# Patient Record
Sex: Female | Born: 1985
Health system: Southern US, Community
[De-identification: ages and names within clinical notes are randomized; demographics above are authoritative.]

## PROBLEM LIST (undated history)

## (undated) DIAGNOSIS — F502 Bulimia nervosa, unspecified: Secondary | ICD-10-CM

## (undated) DIAGNOSIS — N200 Calculus of kidney: Secondary | ICD-10-CM

## (undated) HISTORY — DX: Bulimia nervosa: F50.2

## (undated) HISTORY — DX: Calculus of kidney: N20.0

## (undated) HISTORY — DX: Bulimia nervosa, unspecified: F50.20

---

## 2006-11-30 ENCOUNTER — Emergency Department: Payer: Self-pay | Admitting: Emergency Medicine

## 2012-11-04 ENCOUNTER — Emergency Department: Payer: Self-pay | Admitting: Emergency Medicine

## 2012-11-04 LAB — COMPREHENSIVE METABOLIC PANEL
Albumin: 3.9 g/dL (ref 3.4–5.0)
BUN: 11 mg/dL (ref 7–18)
Bilirubin,Total: 0.2 mg/dL (ref 0.2–1.0)
Calcium, Total: 9 mg/dL (ref 8.5–10.1)
Chloride: 105 mmol/L (ref 98–107)
Co2: 24 mmol/L (ref 21–32)
Creatinine: 1.03 mg/dL (ref 0.60–1.30)
EGFR (African American): >60
EGFR (Non-African Amer.): >60
Glucose: 154 mg/dL — ABNORMAL HIGH (ref 65–99)
Osmolality: 280 (ref 275–301)
Potassium: 3.6 mmol/L (ref 3.5–5.1)
SGOT(AST): 23 U/L (ref 15–37)
SGPT (ALT): 28 U/L (ref 12–78)
Sodium: 139 mmol/L (ref 136–145)

## 2012-11-04 LAB — URINALYSIS, COMPLETE
Glucose,UR: NEGATIVE mg/dL (ref 0–75)
Nitrite: NEGATIVE
Protein: 100
RBC,UR: 3519 /HPF (ref 0–5)
WBC UR: 45 /HPF (ref 0–5)

## 2012-11-04 LAB — CBC
HGB: 13.5 g/dL (ref 12.0–16.0)
MCHC: 33.6 g/dL (ref 32.0–36.0)

## 2012-11-09 ENCOUNTER — Emergency Department: Payer: Self-pay | Admitting: Emergency Medicine

## 2012-11-09 LAB — URINALYSIS, COMPLETE
Glucose,UR: NEGATIVE mg/dL (ref 0–75)
Ketone: NEGATIVE
Leukocyte Esterase: NEGATIVE
Nitrite: NEGATIVE
Ph: 7 (ref 4.5–8.0)
Protein: NEGATIVE
RBC,UR: 1 /HPF (ref 0–5)
Squamous Epithelial: <1

## 2012-11-16 ENCOUNTER — Emergency Department: Payer: Self-pay | Admitting: Emergency Medicine

## 2012-11-16 LAB — CBC WITH DIFFERENTIAL/PLATELET
Basophil #: 0.1 10*3/uL (ref 0.0–0.1)
Eosinophil %: 0.2 %
HCT: 40.5 % (ref 35.0–47.0)
HGB: 14.1 g/dL (ref 12.0–16.0)
Lymphocyte #: 1.6 10*3/uL (ref 1.0–3.6)
MCHC: 34.8 g/dL (ref 32.0–36.0)
MCV: 86 fL (ref 80–100)
Neutrophil #: 10.1 10*3/uL — ABNORMAL HIGH (ref 1.4–6.5)
Neutrophil %: 80.1 %
Platelet: 357 10*3/uL (ref 150–440)
RDW: 13.7 % (ref 11.5–14.5)
WBC: 12.6 10*3/uL — ABNORMAL HIGH (ref 3.6–11.0)

## 2012-11-16 LAB — COMPREHENSIVE METABOLIC PANEL
Albumin: 4.3 g/dL (ref 3.4–5.0)
Bilirubin,Total: 0.4 mg/dL (ref 0.2–1.0)
Chloride: 106 mmol/L (ref 98–107)
EGFR (African American): >60
EGFR (Non-African Amer.): >60
Potassium: 3.7 mmol/L (ref 3.5–5.1)
SGOT(AST): 35 U/L (ref 15–37)
Total Protein: 8 g/dL (ref 6.4–8.2)

## 2012-11-16 LAB — URINALYSIS, COMPLETE
Bacteria: NONE SEEN
Bilirubin,UR: NEGATIVE
Leukocyte Esterase: NEGATIVE
Nitrite: NEGATIVE
Ph: 7 (ref 4.5–8.0)
Protein: NEGATIVE
RBC,UR: 1 /HPF (ref 0–5)
Specific Gravity: 1.013 (ref 1.003–1.030)
WBC UR: <1 /HPF (ref 0–5)

## 2012-11-16 LAB — LIPASE, BLOOD: Lipase: 91 U/L (ref 73–393)

## 2013-09-08 IMAGING — CT CT ABD-PELV W/ CM
1 of 2 series · 15 of 32 positions shown, 19 images · non-contrast
Comparison: none

REASON FOR EXAM: (1) R flank and abd pain; (2) R flank and abd pain
COMMENTS:

[Series 2: 3mm soft tissue · axial · 0.68mm/px · z∈[-438,+3]mm · 15 of 161 slices shown, 19 images]
[im 7/161  soft-tissue]
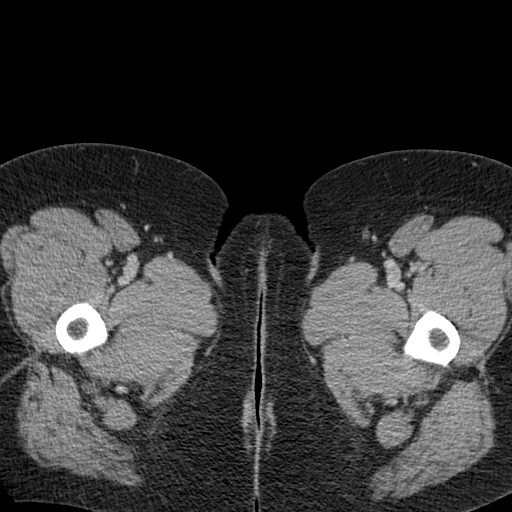
[im 7/161  bone]
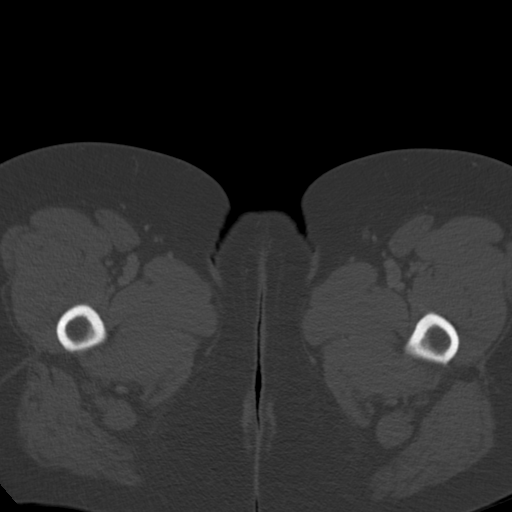
[im 21/161  soft-tissue]
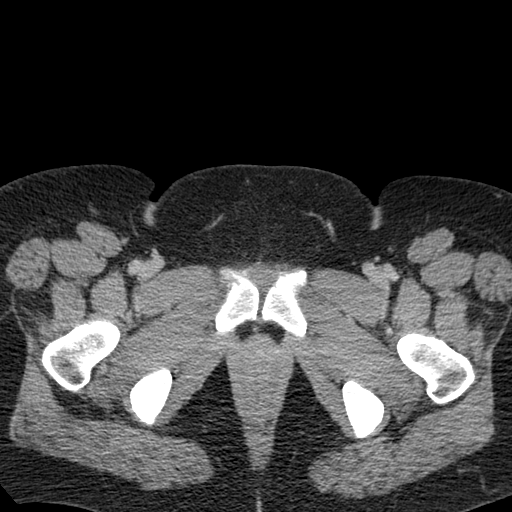
[im 34/161  soft-tissue]
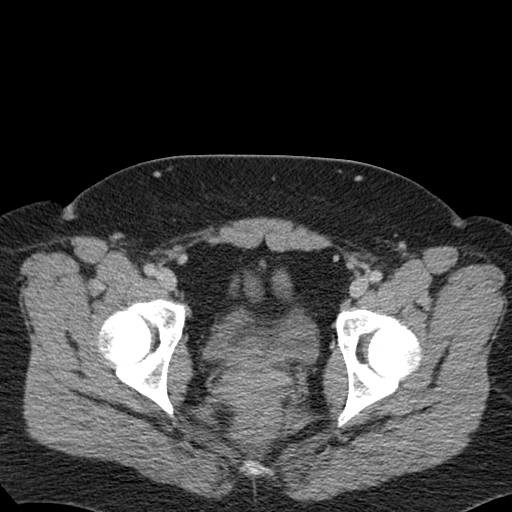
[im 47/161  soft-tissue]
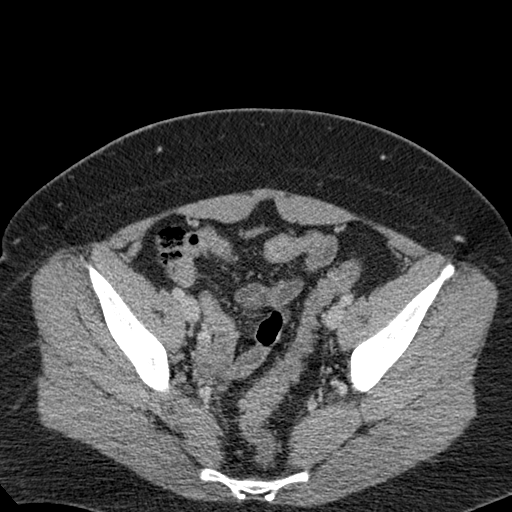
[im 54/161  soft-tissue]
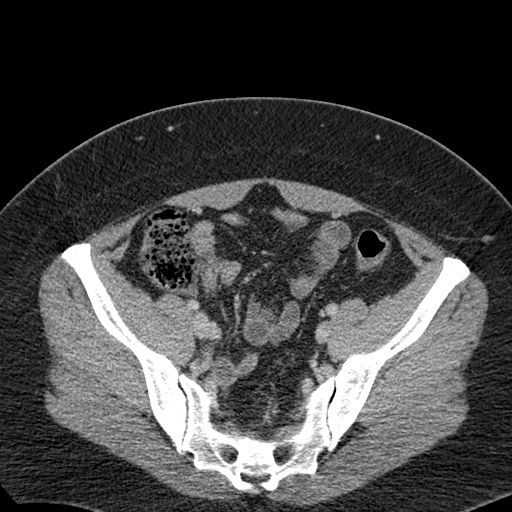
[im 67/161  soft-tissue]
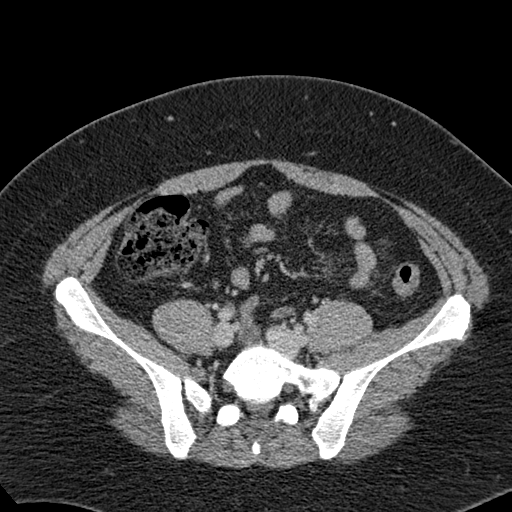
[im 81/161  soft-tissue]
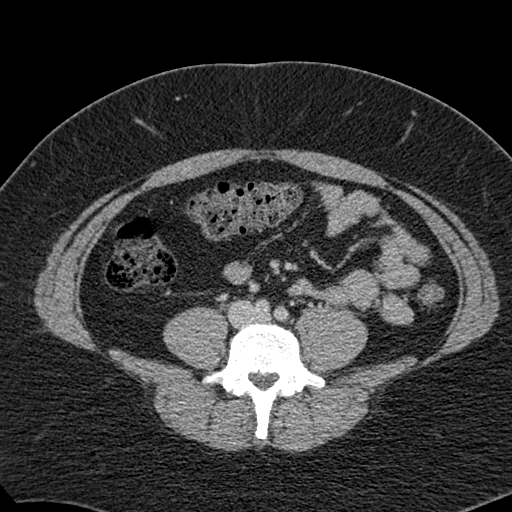
[im 94/161  soft-tissue]
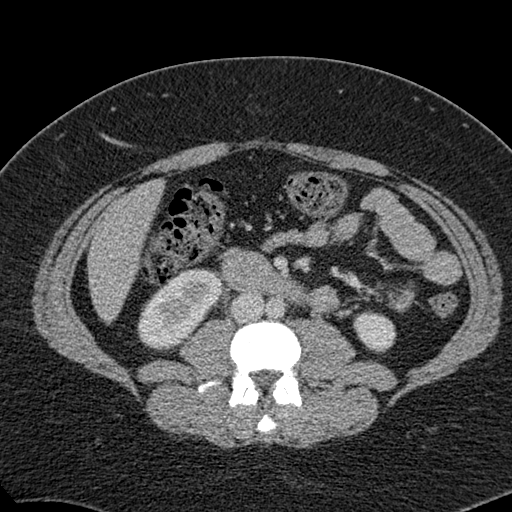
[im 107/161  soft-tissue]
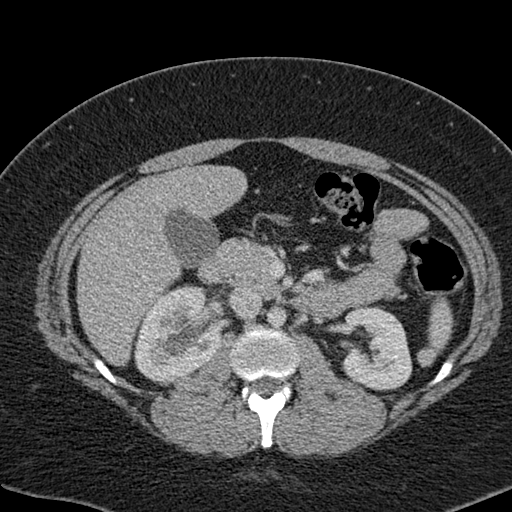
[im 107/161  bone]
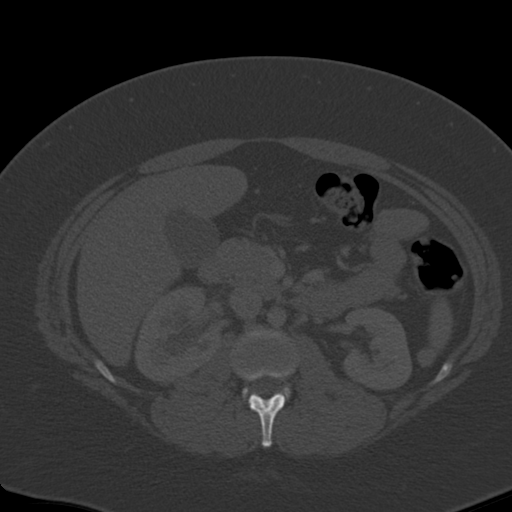
[im 114/161  soft-tissue]
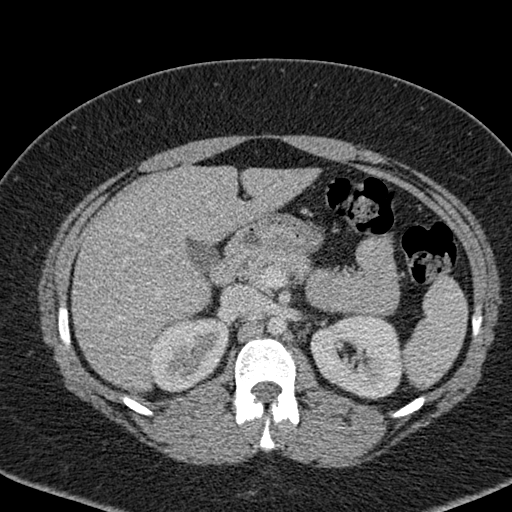
[im 127/161  soft-tissue]
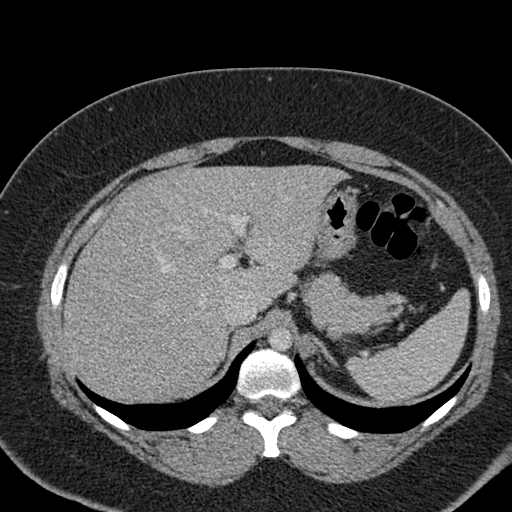
[im 134/161  lung]
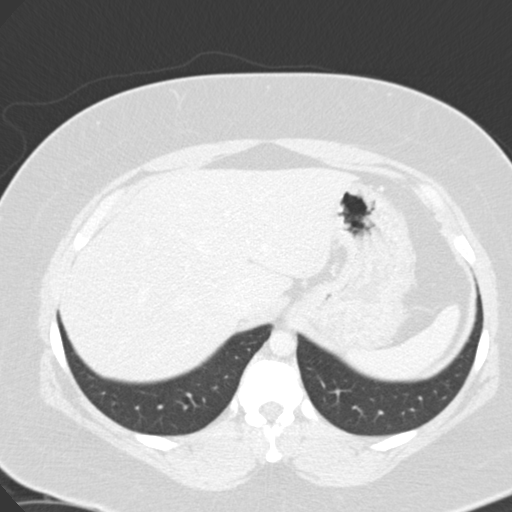
[im 141/161  soft-tissue]
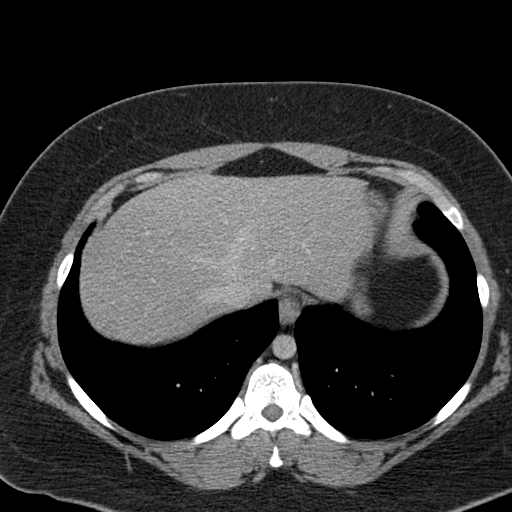
[im 141/161  lung]
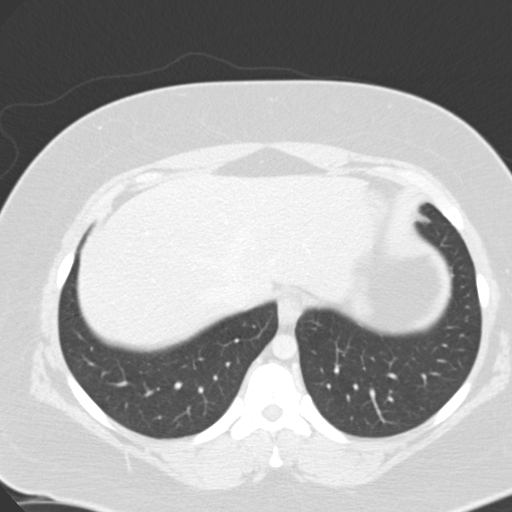
[im 147/161  lung]
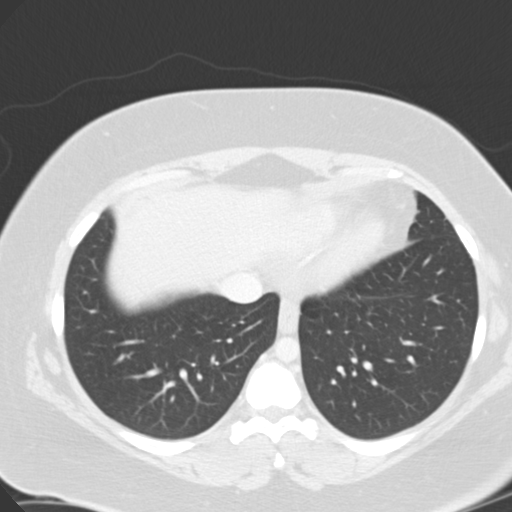
[im 154/161  soft-tissue]
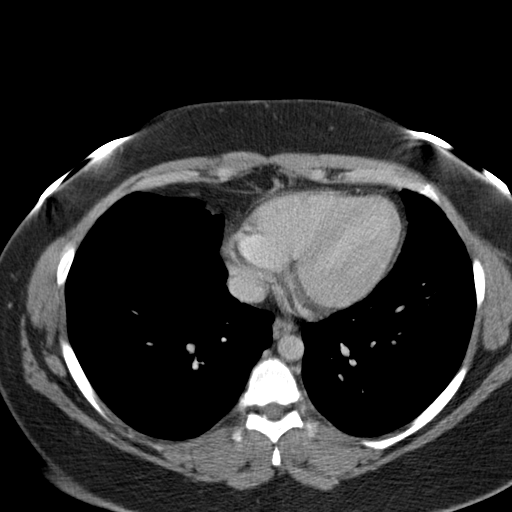
[im 154/161  lung]
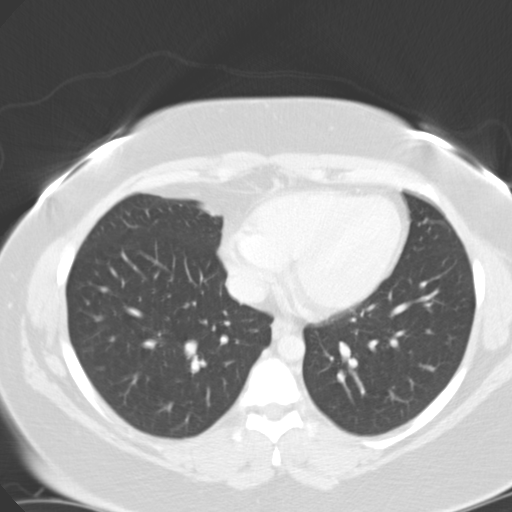

[15 of 32 positions shown; findings below may reference images not displayed]

PROCEDURE:     CT  - CT ABDOMEN / PELVIS  W  - November 04, 2012  [DATE]

RESULT:     In CT of the abdomen and pelvis is performed with 100 mL of
Nsovue-SNS iodinated intravenous contrast. Images are reconstructed in the
axial plane at 3 mm slice thickness. The patient has no previous exam for
comparison.

Images through the base the lungs show normal appearing aeration without a
focal mass, effusion or pneumothorax. There is asymmetric enhancement of the
kidneys with delayed enhancement the right kidney. There is mild right-sided
hydronephrosis. There is mild hydroureter leading to a focal calcific
density in the distal right ureter seen on image 126 and measuring up to 3
mm consistent with a right distal ureteral calculus. The left kidney shows
no obstructive change or nephrolithiasis. There is no ascites. The uterus
and adnexal structures appear unremarkable. A normal appearing appendix is
present. There is no abnormal bowel distention or wall thickening. No
radiopaque gallstones are evident. The liver, spleen, pancreas, gallbladder,
adrenal glands, abdominal aorta and bony structures appear unremarkable.
IMPRESSION: 1. Minimal right-sided hydronephrosis secondary to 3 mm distal right
ureteral stone.

[REDACTED]

## 2015-01-16 DIAGNOSIS — N200 Calculus of kidney: Secondary | ICD-10-CM | POA: Insufficient documentation

## 2015-01-16 DIAGNOSIS — F419 Anxiety disorder, unspecified: Secondary | ICD-10-CM | POA: Insufficient documentation

## 2016-05-18 DIAGNOSIS — F419 Anxiety disorder, unspecified: Secondary | ICD-10-CM | POA: Diagnosis not present

## 2016-05-18 DIAGNOSIS — R6882 Decreased libido: Secondary | ICD-10-CM | POA: Diagnosis not present

## 2016-05-18 DIAGNOSIS — Z6841 Body Mass Index (BMI) 40.0 and over, adult: Secondary | ICD-10-CM | POA: Diagnosis not present

## 2016-05-18 DIAGNOSIS — Z13220 Encounter for screening for lipoid disorders: Secondary | ICD-10-CM | POA: Diagnosis not present

## 2016-08-16 MED FILL — GIANVI 3-0.02 MG TABS: 3-0.02 | 84 days supply | Qty: 84 | Fill #0

## 2016-08-16 MED FILL — ESCITALOPRAM 5 MG TABLET: 5 | 90 days supply | Qty: 90 | Fill #0

## 2016-11-09 MED FILL — LORYNA 3-0.02 MG TAB: 3-0.02 | 84 days supply | Qty: 84 | Fill #0

## 2016-12-02 MED FILL — ESCITALOPRAM 5 MG TABLET: 5 | 90 days supply | Qty: 90 | Fill #1

## 2017-01-31 MED FILL — LORYNA 3-0.02 MG TAB: 3-0.02 | 84 days supply | Qty: 84 | Fill #1

## 2017-04-26 MED FILL — DROSPIR-ETH ESTRA 3/.02 MG: 3-0.02 | 84 days supply | Qty: 84 | Fill #2

## 2017-07-14 MED FILL — ESCITALOPRAM 5 MG TABLET: 5 | 90 days supply | Qty: 90 | Fill #0

## 2017-07-14 MED FILL — DROSPIR-ETH ESTRA 3/.02 MG: 3-0.02 | 84 days supply | Qty: 84 | Fill #3

## 2017-09-28 MED FILL — DROSPIR-ETH ESTRA 3/.02 MG: 3-0.02 | 84 days supply | Qty: 84 | Fill #0

## 2017-12-30 MED FILL — LORYNA 3-0.02 MG TABS: 3-0.02 | 84 days supply | Qty: 84 | Fill #0

## 2018-03-31 MED FILL — LORYNA 3-0.02 MG TABS: 3-0.02 | 84 days supply | Qty: 84 | Fill #0

## 2018-06-21 MED FILL — LORYNA 3-0.02 MG TABS: 3-0.02 | 28 days supply | Qty: 28 | Fill #0

## 2020-11-25 ENCOUNTER — Other Ambulatory Visit: Payer: Self-pay

## 2020-11-25 ENCOUNTER — Encounter: Payer: Self-pay | Admitting: Family Medicine

## 2020-11-25 ENCOUNTER — Ambulatory Visit: Payer: 59 | Admitting: Family Medicine

## 2020-11-25 VITALS — BP 124/72 | HR 91 | Ht 65.0 in | Wt 269.0 lb

## 2020-11-25 DIAGNOSIS — M25571 Pain in right ankle and joints of right foot: Secondary | ICD-10-CM

## 2020-11-25 DIAGNOSIS — F502 Bulimia nervosa: Secondary | ICD-10-CM | POA: Insufficient documentation

## 2020-11-25 DIAGNOSIS — M79671 Pain in right foot: Secondary | ICD-10-CM

## 2020-11-25 DIAGNOSIS — L84 Corns and callosities: Secondary | ICD-10-CM | POA: Diagnosis not present

## 2020-11-25 NOTE — Progress Notes (Signed)
   Subjective:    Patient ID: Jackie Pearson, female    DOB: Feb 06, 1986, 35 y.o.   MRN: [id number]  HPI Chief Complaint  Patient presents with  . new pt get established    New pt get established, right foot callus-    She is new to the practice and here to establish care. Previous medical care: no medical care in 6-7 years   Other providers: Nurse, mental health in [place] for bulimia if needed.   Complains of a large callus on her right foot that is worsening. It has been present for over a month. States it is now affecting how she walks and causing her to have foot pain and ankle pain due to altered gait.  States initially she tried different shoes and OTC callus removal kits without any improvement.    Hx of kidney stones   States she has a history of bulimia that has been in control for the past 3 years.    Denies fever, chills, dizziness, chest pain, palpitations, shortness of breath, abdominal pain, N/V/D, LE edema.   Reviewed allergies, medications, past medical, surgical, family, and social history.     Review of Systems Pertinent positives and negatives in the history of present illness.     Objective:   Physical Exam Constitutional:      General: She is not in acute distress.    Appearance: Normal appearance. She is not ill-appearing.  Musculoskeletal:       Feet:  Feet:     Comments: Large, raised callus to the medial plantar surface of her right foot. Right lateral ankle with trace edema. Normal foot exam otherwise.  Neurological:     Mental Status: She is alert.    BP 124/72   Pulse 91   Ht 5\' 5"  (1.651 m)   Wt 269 lb (122 kg)   LMP 10/29/2020   SpO2 98%   BMI 44.76 kg/m         Assessment & Plan:  Callus of foot - Plan: Ambulatory referral to Podiatry  Acute right ankle pain - Plan: Ambulatory referral to Podiatry  Right foot pain - Plan: Ambulatory referral to Podiatry  She is a pleasant 35 year old female who is new to the practice  here to establish care.  Due to the severity of her callus and foot pain, I am referring her to [facility].  She request the location to be [place]. She will follow-up here as needed or for a CPE.

## 2020-11-25 NOTE — Patient Instructions (Signed)
You will hear from [facility] in [place]

## 2020-11-28 ENCOUNTER — Other Ambulatory Visit: Payer: Self-pay

## 2020-11-28 ENCOUNTER — Encounter: Payer: Self-pay | Admitting: Podiatry

## 2020-11-28 ENCOUNTER — Ambulatory Visit (INDEPENDENT_AMBULATORY_CARE_PROVIDER_SITE_OTHER): Payer: 59 | Admitting: Podiatry

## 2020-11-28 DIAGNOSIS — L989 Disorder of the skin and subcutaneous tissue, unspecified: Secondary | ICD-10-CM

## 2020-11-28 DIAGNOSIS — L84 Corns and callosities: Secondary | ICD-10-CM | POA: Diagnosis not present

## 2020-11-28 NOTE — Progress Notes (Signed)
   Subjective: 35 y.o. female presenting to the office today as a new patient for evaluation of pain in the tenderness to the right plantar heel.  Patient states that approximately 7 months ago she began to develop callus to the area.  She believes it was initiated when she began compensating for left plantar fasciitis.  Over time the callus has become thicker and is very sensitive and tender to walk.  She presents for further treatment and evaluation   Past Medical History:  Diagnosis Date  . Bulimia   . Kidney stones      Objective:  Physical Exam General: Alert and oriented x3 in no acute distress  Dermatology: Diffuse large hyperkeratotic lesion(s) present on the plantar medial aspect of the right heel approximately 6 cm in length x4 cm in width. Pain on palpation noted. Skin is warm, dry and supple bilateral lower extremities. Negative for open lesions or macerations.  Vascular: Palpable pedal pulses bilaterally. No edema or erythema noted. Capillary refill within normal limits.  Neurological: Epicritic and protective threshold grossly intact bilaterally.   Musculoskeletal Exam: Pain on palpation at the keratotic lesion(s) noted. Range of motion within normal limits bilateral. Muscle strength 5/5 in all groups bilateral.  Assessment: 1.  Large hyperkeratotic callus plantar medial aspect right heel   Plan of Care:  1. Patient evaluated 2. Excisional debridement of keratoic lesion(s) using a chisel blade was performed without incident.  Salicylic acid applied 3.  Recommend salicylic acid daily. Salinocaine provided for the patient 4. 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 Felecia Shelling, DPM    [address] [place], [place] [address]                Office [phone]  Fax [phone]

## 2020-12-25 ENCOUNTER — Other Ambulatory Visit: Payer: Self-pay

## 2020-12-26 ENCOUNTER — Encounter: Payer: Self-pay | Admitting: Podiatry

## 2020-12-26 ENCOUNTER — Ambulatory Visit (INDEPENDENT_AMBULATORY_CARE_PROVIDER_SITE_OTHER): Payer: 59 | Admitting: Podiatry

## 2020-12-26 DIAGNOSIS — L84 Corns and callosities: Secondary | ICD-10-CM

## 2020-12-26 DIAGNOSIS — L989 Disorder of the skin and subcutaneous tissue, unspecified: Secondary | ICD-10-CM

## 2020-12-26 NOTE — Progress Notes (Signed)
   Subjective: 35 y.o. female presenting to the office today for follow-up evaluation of a hard thick tender callus to the plantar aspect of the right heel has been going on for over 7 months now.  She has been applying salicylic acid as instructed.  She has noticed significant relief.  She presents for further treatment and evaluation   Past Medical History:  Diagnosis Date   Bulimia    Kidney stones      Objective:  Physical Exam General: Alert and oriented x3 in no acute distress  Dermatology: Diffuse large hyperkeratotic lesion(s) present on the plantar medial aspect of the right heel approximately 6 cm in length x4 cm in width. Pain on palpation noted. Skin is warm, dry and supple bilateral lower extremities. Negative for open lesions or macerations.  Vascular: Palpable pedal pulses bilaterally. No edema or erythema noted. Capillary refill within normal limits.  Neurological: Epicritic and protective threshold grossly intact bilaterally.   Musculoskeletal Exam: Pain on palpation at the keratotic lesion(s) noted. Range of motion within normal limits bilateral. Muscle strength 5/5 in all groups bilateral.  Assessment: 1.  Large hyperkeratotic callus plantar medial aspect right heel   Plan of Care:  1. Patient evaluated 2. Excisional debridement of keratoic lesion(s) using a chisel blade was performed without incident.  Salicylic acid applied 3.  Continue salicylic acid every other day now.  Additional Salinocaine provided for the patient 4. 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 Felecia Shelling, DPM    [address] [place], [place] [address]                Office [phone]  Fax [phone]

## 2020-12-27 ENCOUNTER — Encounter: Payer: Self-pay | Admitting: Podiatry

## 2020-12-31 ENCOUNTER — Other Ambulatory Visit: Payer: Self-pay

## 2020-12-31 ENCOUNTER — Ambulatory Visit (INDEPENDENT_AMBULATORY_CARE_PROVIDER_SITE_OTHER): Payer: 59 | Admitting: Family Medicine

## 2020-12-31 ENCOUNTER — Encounter: Payer: Self-pay | Admitting: Family Medicine

## 2020-12-31 VITALS — BP 120/70 | HR 91 | Ht 65.0 in | Wt 267.2 lb

## 2020-12-31 DIAGNOSIS — Z1159 Encounter for screening for other viral diseases: Secondary | ICD-10-CM

## 2020-12-31 DIAGNOSIS — Z0000 Encounter for general adult medical examination without abnormal findings: Secondary | ICD-10-CM | POA: Diagnosis not present

## 2020-12-31 DIAGNOSIS — Z13220 Encounter for screening for lipoid disorders: Secondary | ICD-10-CM

## 2020-12-31 DIAGNOSIS — Z1329 Encounter for screening for other suspected endocrine disorder: Secondary | ICD-10-CM | POA: Diagnosis not present

## 2020-12-31 NOTE — Patient Instructions (Signed)
Preventive Care 21-34 Years Old, Female Preventive care refers to lifestyle choices and visits with your health care provider that can promote health and wellness. This includes: A yearly physical exam. This is also called an annual wellness visit. Regular dental and eye exams. Immunizations. Screening for certain conditions. Healthy lifestyle choices, such as: Eating a healthy diet. Getting regular exercise. Not using drugs or products that contain nicotine and tobacco. Limiting alcohol use. What can I expect for my preventive care visit? Physical exam Your health care provider may check your: Height and weight. These may be used to calculate your BMI (body mass index). BMI is a measurement that tells if you are at a healthy weight. Heart rate and blood pressure. Body temperature. Skin for abnormal spots. Counseling Your health care provider may ask you questions about your: Past medical problems. Family's medical history. Alcohol, tobacco, and drug use. Emotional well-being. Home life and relationship well-being. Sexual activity. Diet, exercise, and sleep habits. Work and work environment. Access to firearms. Method of birth control. Menstrual cycle. Pregnancy history. What immunizations do I need?  Vaccines are usually given at various ages, according to a schedule. Your health care provider will recommend vaccines for you based on your age, medicalhistory, and lifestyle or other factors, such as travel or where you work. What tests do I need?  Blood tests Lipid and cholesterol levels. These may be checked every 5 years starting at age 20. Hepatitis C test. Hepatitis B test. Screening Diabetes screening. This is done by checking your blood sugar (glucose) after you have not eaten for a while (fasting). STD (sexually transmitted disease) testing, if you are at risk. BRCA-related cancer screening. This may be done if you have a family history of breast, ovarian, tubal, or  peritoneal cancers. Pelvic exam and Pap test. This may be done every 3 years starting at age 21. Starting at age 30, this may be done every 5 years if you have a Pap test in combination with an HPV test. Talk with your health care provider about your test results, treatment options,and if necessary, the need for more tests. Follow these instructions at home: Eating and drinking  Eat a healthy diet that includes fresh fruits and vegetables, whole grains, lean protein, and low-fat dairy products. Take vitamin and mineral supplements as recommended by your health care provider. Do not drink alcohol if: Your health care provider tells you not to drink. You are pregnant, may be pregnant, or are planning to become pregnant. If you drink alcohol: Limit how much you have to 0-1 drink a day. Be aware of how much alcohol is in your drink. In the [place] one drink equals one 12 oz bottle of beer (355 mL), one 5 oz glass of wine (148 mL), or one 1 oz glass of hard liquor (44 mL).  Lifestyle Take daily care of your teeth and gums. Brush your teeth every morning and night with fluoride toothpaste. Floss one time each day. Stay active. Exercise for at least 30 minutes 5 or more days each week. Do not use any products that contain nicotine or tobacco, such as cigarettes, e-cigarettes, and chewing tobacco. If you need help quitting, ask your health care provider. Do not use drugs. If you are sexually active, practice safe sex. Use a condom or other form of protection to prevent STIs (sexually transmitted infections). If you do not wish to become pregnant, use a form of birth control. If you plan to become pregnant, see your health care   provider for a prepregnancy visit. Find healthy ways to cope with stress, such as: Meditation, yoga, or listening to music. Journaling. Talking to a trusted person. Spending time with friends and family. Safety Always wear your seat belt while driving or riding in a  vehicle. Do not drive: If you have been drinking alcohol. Do not ride with someone who has been drinking. When you are tired or distracted. While texting. Wear a helmet and other protective equipment during sports activities. If you have firearms in your house, make sure you follow all gun safety procedures. Seek help if you have been physically or sexually abused. What's next? Go to your health care provider once a year for an annual wellness visit. Ask your health care provider how often you should have your eyes and teeth checked. Stay up to date on all vaccines. This information is not intended to replace advice given to you by your health care provider. Make sure you discuss any questions you have with your healthcare provider. Document Revised: 03/02/2020 Document Reviewed: 03/16/2018 Elsevier Patient Education  2022 [employer].

## 2020-12-31 NOTE — Progress Notes (Signed)
Subjective:    Patient ID: Jackie Pearson, female    DOB: 08/23/1985, 35 y.o.   MRN: [id number]  HPI Chief Complaint  Patient presents with   fasting cpe    Fasting cpe. Did have some candy at 10am, declines pap today and will schedule another time   She is fairly new to the practice and here for a complete physical exam.   Other providers: Dentist Therapist in [place] for bulimia if needed. Podiatrist    Social history: Lives with husband. Worked as a Building services engineer  Denies Smoking or  drug use  Alcohol use is rare  Diet: nothing specific  Excerise: every other day she walks 2 miles   Immunizations: declines Covid vaccines   Health maintenance:   Mammogram: N/A Colonoscopy: N/A Last Gynecological Exam: several years ago  Last Menstrual cycle: 12/18/2020 Last Dental Exam: 2 months ago  Last Eye Exam: years ago   Wears seatbelt always, uses sunscreen, smoke detectors in home and functioning, does not text while driving and feels safe in home environment.   Reviewed allergies, medications, past medical, surgical, family, and social history.    Review of Systems Review of Systems Constitutional: -fever, -chills, -sweats, -unexpected weight change,-fatigue ENT: -runny nose, -ear pain, -sore throat Cardiology:  -chest pain, -palpitations, -edema Respiratory: -cough, -shortness of breath, -wheezing Gastroenterology: -abdominal pain, -nausea, -vomiting, -diarrhea, -constipation  Hematology: -bleeding or bruising problems Musculoskeletal: -arthralgias, -myalgias, -joint swelling, -back pain Ophthalmology: -vision changes Urology: -dysuria, -difficulty urinating, -hematuria, -urinary frequency, -urgency Neurology: -headache, -weakness, -tingling, -numbness        Objective:   Physical Exam BP 120/70   Pulse 91   Ht 5\' 5"  (1.651 m)   Wt 267 lb 3.2 oz (121.2 kg)   LMP 12/18/2020   BMI 44.46 kg/m   General Appearance:    Alert, cooperative, no distress, appears  stated age  Head:    Normocephalic, without obvious abnormality, atraumatic  Eyes:    PERRL, conjunctiva/corneas clear, EOM's intact  Ears:    Normal TM's and external ear canals  Nose: Mask on  Throat: Mask on  Neck:   Supple, no lymphadenopathy;  thyroid:  no   enlargement/tenderness/nodules; no JVD  Back:    Spine nontender, no curvature, ROM normal, no CVA     tenderness  Lungs:     Clear to auscultation bilaterally without wheezes, rales or     ronchi; respirations unlabored  Chest Wall:    No tenderness or deformity   Heart:    Regular rate and rhythm, S1 and S2 normal, no murmur, rub   or gallop  Breast Exam:  Declines  Abdomen:     Soft, non-tender, nondistended, normoactive bowel sounds,    no masses, no hepatosplenomegaly  Genitalia:  Declines  Rectal:    Not performed due to age<40 and no related complaints  Extremities:   No clubbing, cyanosis or edema  Pulses:   2+ and symmetric all extremities  Skin:   Skin color, texture, turgor normal, no rashes or lesions  Lymph nodes:   Cervical, supraclavicular nodes are normal  Neurologic:   CNII-XII intact, normal strength, sensation and gait          Psych:   Normal mood, affect, hygiene and grooming.         Assessment & Plan:  Routine general medical examination at a health care facility - Plan: CBC with Differential/Platelet, Comprehensive metabolic panel, TSH, T4, free, T3 -Preventive health care reviewed.  She declines breast  and pelvic exam today but may call and schedule for this since she does not have an OB/GYN.  Counseling on healthy lifestyle including diet and exercise.  Recommend regular dental and eye exams.  Immunizations reviewed.  She declines COVID-vaccine.  Discussed safety and health promotion.  Morbid obesity (HCC) - Plan: TSH, T4, free, T3, Lipid panel -Recommend she call and schedule with her counselor due to bulimia and recent increased stress with fostering kittens.  States she has not been eating  much.  Need for hepatitis C screening test - Plan: Hepatitis C antibody -Done per screening guidelines  Screening for lipid disorders  Screening for thyroid disorder - Plan: TSH, T4, free, T3

## 2021-01-01 ENCOUNTER — Encounter: Payer: Self-pay | Admitting: Family Medicine

## 2021-01-01 DIAGNOSIS — E038 Other specified hypothyroidism: Secondary | ICD-10-CM | POA: Insufficient documentation

## 2021-01-01 DIAGNOSIS — E7800 Pure hypercholesterolemia, unspecified: Secondary | ICD-10-CM

## 2021-01-01 HISTORY — DX: Pure hypercholesterolemia, unspecified: E78.00

## 2021-01-01 HISTORY — DX: Other specified hypothyroidism: E03.8

## 2021-01-01 LAB — CBC WITH DIFFERENTIAL/PLATELET
Basophils Absolute: 0.1 10*3/uL (ref 0.0–0.2)
Basos: 1 %
EOS (ABSOLUTE): 0.1 10*3/uL (ref 0.0–0.4)
Eos: 1 %
Hematocrit: 40.6 % (ref 34.0–46.6)
Hemoglobin: 13.8 g/dL (ref 11.1–15.9)
Immature Grans (Abs): 0 10*3/uL (ref 0.0–0.1)
Immature Granulocytes: 0 %
Lymphocytes Absolute: 1.9 10*3/uL (ref 0.7–3.1)
Lymphs: 14 %
MCH: 29.8 pg (ref 26.6–33.0)
MCHC: 34 g/dL (ref 31.5–35.7)
MCV: 88 fL (ref 79–97)
Monocytes Absolute: 0.9 10*3/uL (ref 0.1–0.9)
Monocytes: 7 %
Neutrophils Absolute: 10.6 10*3/uL — ABNORMAL HIGH (ref 1.4–7.0)
Neutrophils: 77 %
Platelets: 350 10*3/uL (ref 150–450)
RBC: 4.63 x10E6/uL (ref 3.77–5.28)
RDW: 13.2 % (ref 11.7–15.4)
WBC: 13.6 10*3/uL — ABNORMAL HIGH (ref 3.4–10.8)

## 2021-01-01 LAB — COMPREHENSIVE METABOLIC PANEL
ALT: 24 IU/L (ref 0–32)
AST: 34 IU/L (ref 0–40)
Albumin/Globulin Ratio: 2 (ref 1.2–2.2)
Albumin: 4.7 g/dL (ref 3.8–4.8)
Alkaline Phosphatase: 87 IU/L (ref 44–121)
BUN/Creatinine Ratio: 10 (ref 9–23)
BUN: 8 mg/dL (ref 6–20)
Bilirubin Total: 0.2 mg/dL (ref 0.0–1.2)
CO2: 16 mmol/L — ABNORMAL LOW (ref 20–29)
Calcium: 9.5 mg/dL (ref 8.7–10.2)
Chloride: 102 mmol/L (ref 96–106)
Creatinine, Ser: 0.8 mg/dL (ref 0.57–1.00)
Globulin, Total: 2.3 g/dL (ref 1.5–4.5)
Glucose: 93 mg/dL (ref 65–99)
Potassium: 4.1 mmol/L (ref 3.5–5.2)
Sodium: 140 mmol/L (ref 134–144)
Total Protein: 7 g/dL (ref 6.0–8.5)
eGFR: 100 mL/min/{1.73_m2} (ref >59)

## 2021-01-01 LAB — HEPATITIS C ANTIBODY: Hep C Virus Ab: <0.1 s/co ratio (ref 0.0–0.9)

## 2021-01-01 LAB — LIPID PANEL
Chol/HDL Ratio: 3.7 ratio (ref 0.0–4.4)
Cholesterol, Total: 215 mg/dL — ABNORMAL HIGH (ref 100–199)
HDL: 58 mg/dL (ref >39)
LDL Chol Calc (NIH): 140 mg/dL — ABNORMAL HIGH (ref 0–99)
Triglycerides: 93 mg/dL (ref 0–149)
VLDL Cholesterol Cal: 17 mg/dL (ref 5–40)

## 2021-01-01 LAB — T3: T3, Total: 121 ng/dL (ref 71–180)

## 2021-01-01 LAB — TSH: TSH: 5.38 u[IU]/mL — ABNORMAL HIGH (ref 0.450–4.500)

## 2021-01-01 LAB — T4, FREE: Free T4: 1.03 ng/dL (ref 0.82–1.77)

## 2021-01-06 NOTE — Progress Notes (Signed)
Please make sure she read her mychart message with my recommendations. I received a note that she had not.

## 2021-01-14 ENCOUNTER — Encounter: Payer: Self-pay | Admitting: Family Medicine

## 2021-01-27 ENCOUNTER — Ambulatory Visit (INDEPENDENT_AMBULATORY_CARE_PROVIDER_SITE_OTHER): Payer: 59 | Admitting: Podiatry

## 2021-01-27 ENCOUNTER — Other Ambulatory Visit: Payer: Self-pay

## 2021-01-27 DIAGNOSIS — L989 Disorder of the skin and subcutaneous tissue, unspecified: Secondary | ICD-10-CM | POA: Diagnosis not present

## 2021-01-27 DIAGNOSIS — L84 Corns and callosities: Secondary | ICD-10-CM

## 2021-01-27 MED ORDER — BETAMETHASONE DIPROPIONATE 0.05 % EX CREA: TOPICAL_CREAM | Freq: Two times a day (BID) | CUTANEOUS | 2 refills | Status: DC

## 2021-01-27 NOTE — Progress Notes (Signed)
   Subjective: 35 y.o. female presenting to the office today for follow-up evaluation of a hard thick tender callus to the plantar aspect of the right heel has been going on for over 7 months now.  She has been applying salicylic acid as instructed.  She has noticed significant relief.  She presents for further treatment and evaluation   Past Medical History:  Diagnosis Date   Bulimia    Elevated LDL cholesterol level 01/01/2021   Kidney stones    Subclinical hypothyroidism 01/01/2021     Objective:  Physical Exam General: Alert and oriented x3 in no acute distress  Dermatology: Diffuse large hyperkeratotic lesion(s) present on the plantar medial aspect of the right heel approximately 6 cm in length x4 cm in width. Pain on palpation noted. Skin is warm, dry and supple bilateral lower extremities. Negative for open lesions or macerations.  Vascular: Palpable pedal pulses bilaterally. No edema or erythema noted. Capillary refill within normal limits.  Neurological: Epicritic and protective threshold grossly intact bilaterally.   Musculoskeletal Exam: Pain on palpation at the keratotic lesion(s) noted. Range of motion within normal limits bilateral. Muscle strength 5/5 in all groups bilateral.  Assessment: 1.  Large hyperkeratotic callus plantar medial aspect right heel   Plan of Care:  1. Patient evaluated 2. Excisional debridement of keratoic lesion(s) using a chisel blade was performed without incident.  Salicylic acid applied 3.  Continue salicylic acid every other day now.  Additional Salinocaine provided for the patient 4.  Prescription for betamethasone 0.05% cream to also be applied to the heel daily  5.  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 Felecia Shelling, DPM    [address] [place], [place] [address]                Office [phone]  Fax [phone]

## 2021-02-17 NOTE — Telephone Encounter (Signed)
Thanks

## 2021-03-03 ENCOUNTER — Other Ambulatory Visit: Payer: Self-pay

## 2021-03-03 ENCOUNTER — Ambulatory Visit (INDEPENDENT_AMBULATORY_CARE_PROVIDER_SITE_OTHER): Payer: 59 | Admitting: Podiatry

## 2021-03-03 DIAGNOSIS — L84 Corns and callosities: Secondary | ICD-10-CM | POA: Diagnosis not present

## 2021-03-03 DIAGNOSIS — L989 Disorder of the skin and subcutaneous tissue, unspecified: Secondary | ICD-10-CM | POA: Diagnosis not present

## 2021-03-03 NOTE — Progress Notes (Signed)
   Subjective: 35 y.o. female presenting to the office today for follow-up evaluation of a hard thick tender callus to the plantar aspect of the right heel has been going on for over 7 months now.  Patient continues to see improvement.  She continues to apply the salicylic acid as instructed.  No new complaints at this time  Past Medical History:  Diagnosis Date   Bulimia    Elevated LDL cholesterol level 01/01/2021   Kidney stones    Subclinical hypothyroidism 01/01/2021     Objective:  Physical Exam General: Alert and oriented x3 in no acute distress  Dermatology: Diffuse large hyperkeratotic lesion(s) present on the plantar medial aspect of the right heel approximately 6 cm in length x4 cm in width with significant improvement.  Hyperkeratosis of the skin is not near as prominent or painful.  Vascular: Palpable pedal pulses bilaterally. No edema or erythema noted. Capillary refill within normal limits.  Neurological: Epicritic and protective threshold grossly intact bilaterally.   Musculoskeletal Exam: Minimal tenderness on palpation at the keratotic lesion(s) noted. Range of motion within normal limits bilateral. Muscle strength 5/5 in all groups bilateral.  Assessment: 1.  Large hyperkeratotic callus plantar medial aspect right heel; improving   Plan of Care:  1. Patient evaluated 2. Excisional debridement of keratoic lesion(s) using a chisel blade was performed without incident.   3.  Today we are going to discontinue salicylic acid applications. 4.  Revitaderm Urea 40% lotion provided to apply daily 5.  Continue betamethasone cream for inflammation of the dermis 6.  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  Also available at [facility]  Felecia Shelling, DPM [facility]  Dr. Felecia Shelling, DPM    [address] [place], [place] [address]                Office [phone]  Fax [phone]

## 2021-04-29 ENCOUNTER — Encounter: Payer: Self-pay | Admitting: Podiatry

## 2021-04-29 ENCOUNTER — Telehealth: Payer: Self-pay | Admitting: Podiatry

## 2021-04-29 NOTE — Telephone Encounter (Signed)
Pt. Call and said medication was sent to wrong pharmacy please send to [facility]

## 2021-05-05 ENCOUNTER — Ambulatory Visit: Payer: 59 | Admitting: Podiatry

## 2021-05-09 ENCOUNTER — Encounter: Payer: Self-pay | Admitting: Podiatry

## 2021-05-10 ENCOUNTER — Other Ambulatory Visit: Payer: Self-pay | Admitting: Podiatry

## 2021-05-10 MED ORDER — BETAMETHASONE DIPROPIONATE 0.05 % EX CREA
TOPICAL_CREAM | Freq: Two times a day (BID) | CUTANEOUS | 2 refills | Status: DC
  Filled 2021-05-10: qty 45, fill #0

## 2021-05-10 MED ORDER — BETAMETHASONE DIPROPIONATE 0.05 % EX CREA
TOPICAL_CREAM | Freq: Two times a day (BID) | CUTANEOUS | 2 refills | Status: DC
  Filled 2021-05-10: qty 45, 20d supply, fill #0
  Filled 2022-01-28: qty 45, 20d supply, fill #1

## 2021-05-11 ENCOUNTER — Other Ambulatory Visit (HOSPITAL_COMMUNITY): Payer: Self-pay

## 2021-10-27 ENCOUNTER — Encounter: Payer: Self-pay | Admitting: Physician Assistant

## 2021-10-27 ENCOUNTER — Ambulatory Visit: Payer: 59 | Admitting: Physician Assistant

## 2021-10-27 VITALS — BP 144/92 | HR 92 | Ht 65.0 in | Wt 277.7 lb

## 2021-10-27 DIAGNOSIS — J309 Allergic rhinitis, unspecified: Secondary | ICD-10-CM | POA: Diagnosis not present

## 2021-10-27 DIAGNOSIS — E038 Other specified hypothyroidism: Secondary | ICD-10-CM | POA: Diagnosis not present

## 2021-10-27 DIAGNOSIS — L659 Nonscarring hair loss, unspecified: Secondary | ICD-10-CM | POA: Diagnosis not present

## 2021-10-27 DIAGNOSIS — N926 Irregular menstruation, unspecified: Secondary | ICD-10-CM

## 2021-10-27 NOTE — Progress Notes (Signed)
? ?Established Patient Office Visit ? ?Subjective:  ?Patient ID: Jackie Pearson, female    DOB: 1986-03-08  Age: 36 y.o. MRN: [id number] ? ?CC:  ?Chief Complaint  ?Patient presents with  ? Follow-up  ?  Follow up on previous labs and she is now experiencing hair thinning and irregular periods. She does not have an OBGYN.  ? ? ?HPI ?Jackie Pearson presents for a follow up of subclinical hypothyroidism; reports that she noticed a lot of hair loss since 03/2021, started hair supplement 04/2021 that has been helping, but requests to get her thyroid checked;  ?Also reports irregular menses with only 2 days of bleeding for the past 2 months; denies birth control; has noticed increased cramping; states her last pap smear was normal about 10 years ago. ? ?Outpatient Medications Prior to Visit  ?Medication Sig Dispense Refill  ? Ascorbic Acid (VITAMIN C) 1000 MG tablet Take 1,000 mg by mouth daily.    ? betamethasone dipropionate 0.05 % cream Apply topically 2 (two) times daily. 45 g 2  ? ibuprofen (ADVIL) 200 MG tablet Take 200 mg by mouth every 6 (six) hours as needed.    ? loratadine (CLARITIN) 10 MG tablet Take 10 mg by mouth daily.    ? ?No facility-administered medications prior to visit.  ? ? ?No Known Allergies ? ?ROS ?Review of Systems  ?Constitutional:  Negative for activity change, chills and unexpected weight change.  ?HENT:  Negative for congestion and voice change.   ?Eyes:  Negative for pain and redness.  ?Respiratory:  Negative for cough and wheezing.   ?Cardiovascular:  Negative for chest pain.  ?Gastrointestinal:  Negative for constipation, diarrhea, nausea and vomiting.  ?Endocrine: Negative for polyuria.  ?Genitourinary:  Negative for frequency.  ?Skin:  Negative for color change and rash.  ?Allergic/Immunologic: Negative for immunocompromised state.  ?Neurological:  Negative for dizziness.  ?Psychiatric/Behavioral:  Negative for agitation.   ? ?  ?Objective:  ?  ?Physical Exam ?Vitals and nursing note  reviewed.  ?Constitutional:   ?   General: She is not in acute distress. ?   Appearance: She is normal weight. She is not ill-appearing.  ?HENT:  ?   Head: Normocephalic and atraumatic.  ?   Right Ear: External ear normal.  ?   Left Ear: External ear normal.  ?Eyes:  ?   Extraocular Movements: Extraocular movements intact.  ?   Conjunctiva/sclera: Conjunctivae normal.  ?   Pupils: Pupils are equal, round, and reactive to light.  ?Cardiovascular:  ?   Rate and Rhythm: Normal rate and regular rhythm.  ?Pulmonary:  ?   Effort: Pulmonary effort is normal.  ?   Breath sounds: Normal breath sounds. No wheezing.  ?Abdominal:  ?   General: Bowel sounds are normal.  ?   Palpations: Abdomen is soft.  ?Musculoskeletal:     ?   General: Normal range of motion.  ?   Cervical back: Normal range of motion.  ?Skin: ?   General: Skin is warm and dry.  ?Neurological:  ?   Mental Status: She is alert and oriented to person, place, and time.  ?Psychiatric:     ?   Mood and Affect: Mood normal.  ? ? ?BP (!) 144/92   Pulse 92   Wt 277 lb 11.2 oz (126 kg)   LMP 10/24/2021   SpO2 97%   BMI 46.21 kg/m?  ? ?BP Readings from Last 5 Encounters:  ?10/27/21 (!) 144/92  ?12/31/20 120/70  ?11/25/20 124/72  ? ? ? ?  Wt Readings from Last 3 Encounters:  ?10/27/21 277 lb 11.2 oz (126 kg)  ?12/31/20 267 lb 3.2 oz (121.2 kg)  ?11/25/20 269 lb (122 kg)  ? ? ?Results for orders placed or performed in visit on 12/31/20  ?CBC with Differential/Platelet  ?Result Value Ref Range  ? WBC 13.6 (H) 3.4 - 10.8 x10E3/uL  ? RBC 4.63 3.77 - 5.28 x10E6/uL  ? Hemoglobin 13.8 11.1 - 15.9 g/dL  ? Hematocrit 40.6 34.0 - 46.6 %  ? MCV 88 79 - 97 fL  ? MCH 29.8 26.6 - 33.0 pg  ? MCHC 34.0 31.5 - 35.7 g/dL  ? RDW 13.2 11.7 - 15.4 %  ? Platelets 350 150 - 450 x10E3/uL  ? Neutrophils 77 Not Estab. %  ? Lymphs 14 Not Estab. %  ? Monocytes 7 Not Estab. %  ? Eos 1 Not Estab. %  ? Basos 1 Not Estab. %  ? Neutrophils Absolute 10.6 (H) 1.4 - 7.0 x10E3/uL  ? Lymphocytes Absolute  1.9 0.7 - 3.1 x10E3/uL  ? Monocytes Absolute 0.9 0.1 - 0.9 x10E3/uL  ? EOS (ABSOLUTE) 0.1 0.0 - 0.4 x10E3/uL  ? Basophils Absolute 0.1 0.0 - 0.2 x10E3/uL  ? Immature Granulocytes 0 Not Estab. %  ? Immature Grans (Abs) 0.0 0.0 - 0.1 x10E3/uL  ?Comprehensive metabolic panel  ?Result Value Ref Range  ? Glucose 93 65 - 99 mg/dL  ? BUN 8 6 - 20 mg/dL  ? Creatinine, Ser 0.80 0.57 - 1.00 mg/dL  ? eGFR 100 >59 mL/min/1.73  ? BUN/Creatinine Ratio 10 9 - 23  ? Sodium 140 134 - 144 mmol/L  ? Potassium 4.1 3.5 - 5.2 mmol/L  ? Chloride 102 96 - 106 mmol/L  ? CO2 16 (L) 20 - 29 mmol/L  ? Calcium 9.5 8.7 - 10.2 mg/dL  ? Total Protein 7.0 6.0 - 8.5 g/dL  ? Albumin 4.7 3.8 - 4.8 g/dL  ? Globulin, Total 2.3 1.5 - 4.5 g/dL  ? Albumin/Globulin Ratio 2.0 1.2 - 2.2  ? Bilirubin Total 0.2 0.0 - 1.2 mg/dL  ? Alkaline Phosphatase 87 44 - 121 IU/L  ? AST 34 0 - 40 IU/L  ? ALT 24 0 - 32 IU/L  ?TSH  ?Result Value Ref Range  ? TSH 5.380 (H) 0.450 - 4.500 uIU/mL  ?T4, free  ?Result Value Ref Range  ? Free T4 1.03 0.82 - 1.77 ng/dL  ?T3  ?Result Value Ref Range  ? T3, Total 121 71 - 180 ng/dL  ?Lipid panel  ?Result Value Ref Range  ? Cholesterol, Total 215 (H) 100 - 199 mg/dL  ? Triglycerides 93 0 - 149 mg/dL  ? HDL 58 >39 mg/dL  ? VLDL Cholesterol Cal 17 5 - 40 mg/dL  ? LDL Chol Calc (NIH) 140 (H) 0 - 99 mg/dL  ? Chol/HDL Ratio 3.7 0.0 - 4.4 ratio  ?Hepatitis C antibody  ?Result Value Ref Range  ? Hep C Virus Ab <0.1 0.0 - 0.9 s/co ratio  ?  ? ?Last CBC ?Lab Results  ?Component Value Date  ? WBC 13.6 (H) 12/31/2020  ? HGB 13.8 12/31/2020  ? HCT 40.6 12/31/2020  ? MCV 88 12/31/2020  ? MCH 29.8 12/31/2020  ? RDW 13.2 12/31/2020  ? PLT 350 12/31/2020  ? ?Last metabolic panel ?Lab Results  ?Component Value Date  ? GLUCOSE 93 12/31/2020  ? NA 140 12/31/2020  ? K 4.1 12/31/2020  ? CL 102 12/31/2020  ? CO2 16 (L) 12/31/2020  ?  BUN 8 12/31/2020  ? CREATININE 0.80 12/31/2020  ? EGFR 100 12/31/2020  ? CALCIUM 9.5 12/31/2020  ? PROT 7.0 12/31/2020  ?  ALBUMIN 4.7 12/31/2020  ? LABGLOB 2.3 12/31/2020  ? AGRATIO 2.0 12/31/2020  ? BILITOT 0.2 12/31/2020  ? ALKPHOS 87 12/31/2020  ? AST 34 12/31/2020  ? ALT 24 12/31/2020  ? ANIONGAP 8 11/16/2012  ? ?Last thyroid functions ?Lab Results  ?Component Value Date  ? TSH 5.380 (H) 12/31/2020  ? T3TOTAL 121 12/31/2020  ? ?  ? ?The ASCVD Risk score (Arnett DK, et al., 2019) failed to calculate for the following reasons: ?  The 2019 ASCVD risk score is only valid for ages 25 to 74 ? ?  ?Assessment & Plan:  ? ?Problem List Items Addressed This Visit   ? ?  ? Respiratory  ? Mild allergic rhinitis  ? Relevant Orders  ? CBC with Differential/Platelet ?Continue OTC antihistamine as needed  ?  ? Endocrine  ? Subclinical hypothyroidism  ?  Will check thyroid panel with TSH today ?  ?  ? Relevant Orders  ? Thyroid Panel With TSH  ? ?Other Visit Diagnoses   ? ? Hair loss    -  Primary  ? Relevant Orders  ? CBC with Differential/Platelet  ? Thyroid Panel With TSH  ? Irregular menses      ? Relevant Orders  ? Ambulatory referral to Gynecology  ? CBC with Differential/Platelet  ? Thyroid Panel With TSH  ? ?  ? ? ?No orders of the defined types were placed in this encounter. ? ? ?Follow-up: Return in about 2 months (around 01/04/2022) for Return for Annual Exam.  ? ? ?Irene Pap, PA-C ?

## 2021-10-27 NOTE — Assessment & Plan Note (Signed)
Stable, drink 8 - 10 glasses of water daily, limit sugar intake and eat a low fat diet, exercise 3 - 5 days a week, example walking 1 - 2 miles  ? ?

## 2021-10-27 NOTE — Assessment & Plan Note (Signed)
Will check thyroid panel with TSH today ?

## 2021-10-27 NOTE — Patient Instructions (Signed)
You will get a call to schedule an appointment with Gynecology ? ?

## 2021-10-28 LAB — CBC WITH DIFFERENTIAL/PLATELET
Basophils Absolute: 0.1 10*3/uL (ref 0.0–0.2)
Basos: 1 %
EOS (ABSOLUTE): 0.1 10*3/uL (ref 0.0–0.4)
Eos: 1 %
Hematocrit: 42.1 % (ref 34.0–46.6)
Hemoglobin: 14.5 g/dL (ref 11.1–15.9)
Immature Grans (Abs): 0.1 10*3/uL (ref 0.0–0.1)
Immature Granulocytes: 1 %
Lymphocytes Absolute: 1.7 10*3/uL (ref 0.7–3.1)
Lymphs: 16 %
MCH: 29.9 pg (ref 26.6–33.0)
MCHC: 34.4 g/dL (ref 31.5–35.7)
MCV: 87 fL (ref 79–97)
Monocytes Absolute: 0.6 10*3/uL (ref 0.1–0.9)
Monocytes: 6 %
Neutrophils Absolute: 7.6 10*3/uL — ABNORMAL HIGH (ref 1.4–7.0)
Neutrophils: 75 %
Platelets: 346 10*3/uL (ref 150–450)
RBC: 4.85 x10E6/uL (ref 3.77–5.28)
RDW: 13.3 % (ref 11.7–15.4)
WBC: 10.1 10*3/uL (ref 3.4–10.8)

## 2021-10-28 LAB — THYROID PANEL WITH TSH
Free Thyroxine Index: 1.6 (ref 1.2–4.9)
T3 Uptake Ratio: 23 % — ABNORMAL LOW (ref 24–39)
T4, Total: 6.8 ug/dL (ref 4.5–12.0)
TSH: 3.11 u[IU]/mL (ref 0.450–4.500)

## 2021-10-29 ENCOUNTER — Encounter: Payer: Self-pay | Admitting: Physician Assistant

## 2021-11-02 ENCOUNTER — Telehealth: Payer: Self-pay

## 2021-11-02 NOTE — Telephone Encounter (Signed)
[place] [facility] referring for Evaluation of irregular menses, last pap smear 2013. Sch CNM/PA -3-4 weeks. Called and left voicemail for patient to call back to be scheduled.

## 2021-11-02 NOTE — Telephone Encounter (Signed)
Patient is scheduled for 11/17/21 with LMD

## 2021-11-02 NOTE — Telephone Encounter (Signed)
-----   Message from Darrel Hoover, RN sent at 10/28/2021 11:27 AM EDT ----- Regarding: referral Schedule with CNM/PA for Evaluation of irregular menses, last pap smear 2013 -3-4 weeks.

## 2021-11-16 ENCOUNTER — Encounter: Payer: 59 | Admitting: Licensed Practical Nurse

## 2021-11-17 ENCOUNTER — Encounter: Payer: 59 | Admitting: Licensed Practical Nurse

## 2021-11-18 ENCOUNTER — Other Ambulatory Visit (HOSPITAL_COMMUNITY)
Admission: RE | Admit: 2021-11-18 | Discharge: 2021-11-18 | Disposition: A | Discharge status: Home / Self Care | Payer: 59 | Source: Ambulatory Visit | Attending: Licensed Practical Nurse | Admitting: Licensed Practical Nurse

## 2021-11-18 ENCOUNTER — Encounter: Payer: Self-pay | Admitting: Licensed Practical Nurse

## 2021-11-18 ENCOUNTER — Ambulatory Visit (INDEPENDENT_AMBULATORY_CARE_PROVIDER_SITE_OTHER): Payer: 59 | Admitting: Licensed Practical Nurse

## 2021-11-18 VITALS — BP 122/82 | Ht 65.0 in | Wt 278.0 lb

## 2021-11-18 DIAGNOSIS — N921 Excessive and frequent menstruation with irregular cycle: Secondary | ICD-10-CM

## 2021-11-18 DIAGNOSIS — Z124 Encounter for screening for malignant neoplasm of cervix: Secondary | ICD-10-CM | POA: Insufficient documentation

## 2021-11-18 DIAGNOSIS — Z131 Encounter for screening for diabetes mellitus: Secondary | ICD-10-CM | POA: Diagnosis not present

## 2021-11-18 DIAGNOSIS — Z01419 Encounter for gynecological examination (general) (routine) without abnormal findings: Secondary | ICD-10-CM | POA: Insufficient documentation

## 2021-11-18 DIAGNOSIS — N926 Irregular menstruation, unspecified: Secondary | ICD-10-CM | POA: Diagnosis not present

## 2021-11-18 NOTE — Progress Notes (Signed)
? ? ? ?Gynecology Annual Exam   ?PCP: Jake Shark, PA-C ? ?Chief Complaint: heavy menstrual periods  ? ? ?History of Present Illness: Patient is a 36 y.o. No obstetric history on file. presents for annual exam. The patient has concerns for heavy cycles  and facial hair, wonders if she has PCOS. She gets her facial hair removed by lasor, the person that does her treatments recommended she be evaluated for PCOS. As far as she knows, she has never been told that she has PCOS. She does have heavy cycles.  ? ?LMP: Patient's last menstrual period was 10/24/2021 (exact date). ?Average Interval: sometimes occur twice in a month,  ?Duration of flow: 7 days ?Heavy Menses: yes, changes a super tampon hourly  ?Clots: yes ?Intermenstrual Bleeding: no ?Postcoital Bleeding: no ?Dysmenorrhea: occasional  ? ?The patient is sexually active. She currently uses vasectomy for contraception. She denies dyspareunia.  The patient does not perform self breast exams.  There is no notable family history of breast or ovarian cancer in her family. ? ?The patient wears seatbelts: yes.   The patient has regular exercise: no.   ? ?The patient reports current symptoms of depression.   ? ?Review of Systems: Review of Systems  ?Constitutional: Negative.   ?Respiratory: Negative.    ?Cardiovascular: Negative.   ?Gastrointestinal: Negative.   ?Skin:   ?     Facial hair   ?Neurological: Negative.   ?Psychiatric/Behavioral:  Positive for depression. The patient is nervous/anxious.   ?Works as a Environmental manager ?Currently experiencing a lot stress as she is the primary caretaker for her mother.  ? ? ?Past Medical History:  ?Patient Active Problem List  ? Diagnosis Date Noted  ? Mild allergic rhinitis 10/27/2021  ? Obesity, morbid, BMI 40.0-49.9 (HCC) 10/27/2021  ? Subclinical hypothyroidism 01/01/2021  ? Elevated LDL cholesterol level 01/01/2021  ? Bulimia   ? Anxiety 01/16/2015  ?  Formatting of this note might be different from the  original. ?Struggled since age 58 ? ?  ? Nephrolithiasis 01/16/2015  ? ? ?Past Surgical History:  ?History reviewed. No pertinent surgical history. ? ?Gynecologic History:  ?Patient's last menstrual period was 10/24/2021 (exact date). ?Contraception: vasectomy ?Last Pap: Results were: no abnormalities  ? ?Obstetric History: No obstetric history on file. ? ?Family History:  ?Family History  ?Problem Relation Age of Onset  ? Irregular heart beat Mother   ? Depression Mother   ? Anxiety disorder Mother   ? Transient ischemic attack Mother   ? AAA (abdominal aortic aneurysm) Father   ? Hypertension Maternal Grandmother   ? Coronary artery disease Maternal Grandmother   ? ? ?Social History:  ?Social History  ? ?Socioeconomic History  ? Marital status: Married  ?  Spouse name: Not on file  ? Number of children: Not on file  ? Years of education: Not on file  ? Highest education level: Not on file  ?Occupational History  ? Not on file  ?Tobacco Use  ? Smoking status: Never  ? Smokeless tobacco: Never  ?Substance and Sexual Activity  ? Alcohol use: Never  ? Drug use: Never  ? Sexual activity: Yes  ?  Comment: husband had vasectomy  ?Other Topics Concern  ? Not on file  ?Social History Narrative  ? Not on file  ? ?Social Determinants of Health  ? ?Financial Resource Strain: Not on file  ?Food Insecurity: Not on file  ?Transportation Needs: Not on file  ?Physical Activity: Not on file  ?Stress: Not  on file  ?Social Connections: Not on file  ?Intimate Partner Violence: Not on file  ? ? ?Allergies:  ?No Known Allergies ? ?Medications: ?Prior to Admission medications   ?Medication Sig Start Date End Date Taking? Authorizing Provider  ?Ascorbic Acid (VITAMIN C) 1000 MG tablet Take 1,000 mg by mouth daily.   Yes [provider]  ?betamethasone dipropionate 0.05 % cream Apply topically 2 (two) times daily. 05/10/21  Yes Felecia Shelling, DPM  ?ibuprofen (ADVIL) 200 MG tablet Take 200 mg by mouth every 6 (six) hours as  needed.   Yes [provider]  ?loratadine (CLARITIN) 10 MG tablet Take 10 mg by mouth daily.   Yes [provider]  ? ? ?Physical Exam ?Vitals: Blood pressure 122/82, height 5\' 5"  (1.651 m), weight 278 lb (126.1 kg), last menstrual period 10/24/2021. ? ?General: NAD ?HEENT: normocephalic, anicteric ?Thyroid: no enlargement, no palpable nodules ?Pulmonary: No increased work of breathing, CTAB ?Cardiovascular: RRR, distal pulses 2+ ?Breast: Breast symmetrical, no tenderness, no palpable nodules or masses, no skin or nipple retraction present, no nipple discharge.  No axillary or supraclavicular lymphadenopathy. ?Abdomen: NABS, soft, non-tender, non-distended.  Umbilicus without lesions.  No hepatomegaly, splenomegaly or masses palpable. No evidence of hernia  ?Genitourinary: ? External: Normal external female genitalia.  Normal urethral meatus, normal Bartholin's and Skene's glands.   ? Vagina: Normal vaginal mucosa, no evidence of prolapse.   ? Cervix: Grossly normal in appearance, no bleeding ? Uterus: Non-enlarged, mobile, normal contour.  No CMT ? Adnexa: ovaries non-enlarged, no adnexal masses ? Rectal: deferred ? Lymphatic: no evidence of inguinal lymphadenopathy ?Extremities: no edema, erythema, or tenderness ?Neurologic: Grossly intact ?Psychiatric: mood appropriate, affect full ? ? ?Assessment: 36 y.o. No obstetric history on file. routine annual exam ? ?Plan: ?Problem List Items Addressed This Visit   ?None ?Visit Diagnoses   ? ? Well woman exam    -  Primary  ? Relevant Orders  ? Cytology - PAP  ? TSH+Prl+FSH+TestT+LH+DHEA S...  ? Prolactin  ? Hemoglobin A1c  ? Menorrhagia with irregular cycle      ? Relevant Orders  ? TSH+Prl+FSH+TestT+LH+DHEA S...  ? Prolactin  ? Cervical cancer screening      ? Relevant Orders  ? Cytology - PAP  ? Diabetes mellitus screening      ? Relevant Orders  ? Hemoglobin A1c  ? ?  ? ? ?2) STI screening  wasoffered and declined ? ?2)  ASCCP guidelines and  rational discussed.  Patient opts for every 3 years screening interval ? ?3) Contraception - the patient is currently using  vasectomy.  She is happy with her current form of contraception and plans to continue ? ?4) Routine healthcare maintenance including cholesterol, diabetes screening discussed managed by PCP ? ?5) Discussed her heavy menstrual bleeding could be related to anovulatory cycles secondary to obesity, recommend at least 10 percent weight loss. Labs and 20 ordered for PCOS workup.  ? ?6) fu based on labs and pt's desires for treatment regarding heavy bleeding.  ? ?[place], CNM  ?[facility] ?11/18/2021, 3:04 PM ? ? ?  ?

## 2021-11-20 DIAGNOSIS — N926 Irregular menstruation, unspecified: Secondary | ICD-10-CM | POA: Insufficient documentation

## 2021-11-20 LAB — CYTOLOGY - PAP
Chlamydia: NEGATIVE
Comment: NEGATIVE
Comment: NEGATIVE
Comment: NEGATIVE
Comment: NORMAL
Diagnosis: NEGATIVE
High risk HPV: NEGATIVE
Neisseria Gonorrhea: NEGATIVE
Trichomonas: NEGATIVE

## 2021-11-23 LAB — TSH+PRL+FSH+TESTT+LH+DHEA S...
17-Hydroxyprogesterone: 63 ng/dL
Androstenedione: 176 ng/dL (ref 41–262)
DHEA-SO4: 322 ug/dL (ref 84.8–378.0)
FSH: 9 m[IU]/mL
LH: 13.4 m[IU]/mL
Prolactin: 17.1 ng/mL (ref 4.8–23.3)
TSH: 3.77 u[IU]/mL (ref 0.450–4.500)
Testosterone, Free: 5.8 pg/mL — ABNORMAL HIGH (ref 0.0–4.2)
Testosterone: 47 ng/dL (ref 8–60)

## 2021-11-23 LAB — HEMOGLOBIN A1C
Est. average glucose Bld gHb Est-mCnc: 105 mg/dL
Hgb A1c MFr Bld: 5.3 % (ref 4.8–5.6)

## 2021-12-03 ENCOUNTER — Other Ambulatory Visit: Payer: Self-pay | Admitting: Licensed Practical Nurse

## 2021-12-03 ENCOUNTER — Ambulatory Visit (INDEPENDENT_AMBULATORY_CARE_PROVIDER_SITE_OTHER): Payer: 59

## 2021-12-03 DIAGNOSIS — N921 Excessive and frequent menstruation with irregular cycle: Secondary | ICD-10-CM

## 2021-12-03 DIAGNOSIS — Z01419 Encounter for gynecological examination (general) (routine) without abnormal findings: Secondary | ICD-10-CM

## 2021-12-11 ENCOUNTER — Telehealth: Payer: Self-pay

## 2021-12-11 NOTE — Telephone Encounter (Signed)
Patient calling about her ultrasound results.

## 2021-12-15 ENCOUNTER — Other Ambulatory Visit: Payer: Self-pay | Admitting: Licensed Practical Nurse

## 2021-12-15 ENCOUNTER — Other Ambulatory Visit (HOSPITAL_COMMUNITY): Payer: Self-pay

## 2021-12-15 DIAGNOSIS — L680 Hirsutism: Secondary | ICD-10-CM

## 2021-12-15 DIAGNOSIS — Z79899 Other long term (current) drug therapy: Secondary | ICD-10-CM

## 2021-12-15 MED ORDER — SPIRONOLACTONE 50 MG PO TABS
50.0000 mg | ORAL_TABLET | Freq: Two times a day (BID) | ORAL | 2 refills | Status: DC
  Filled 2021-12-15 – 2021-12-23 (×2): qty 60, 15d supply, fill #0

## 2021-12-15 NOTE — Progress Notes (Unsigned)
Reviewed lab and [place] results with Dr Marcelline Mates

## 2021-12-16 ENCOUNTER — Other Ambulatory Visit: Payer: Self-pay

## 2021-12-16 DIAGNOSIS — Z79899 Other long term (current) drug therapy: Secondary | ICD-10-CM

## 2021-12-16 DIAGNOSIS — L680 Hirsutism: Secondary | ICD-10-CM

## 2021-12-18 DIAGNOSIS — Z79899 Other long term (current) drug therapy: Secondary | ICD-10-CM | POA: Diagnosis not present

## 2021-12-18 DIAGNOSIS — L680 Hirsutism: Secondary | ICD-10-CM | POA: Diagnosis not present

## 2021-12-19 LAB — BASIC METABOLIC PANEL
BUN/Creatinine Ratio: 11 (ref 9–23)
BUN: 11 mg/dL (ref 6–20)
CO2: 21 mmol/L (ref 20–29)
Calcium: 9.6 mg/dL (ref 8.7–10.2)
Chloride: 101 mmol/L (ref 96–106)
Creatinine, Ser: 0.99 mg/dL (ref 0.57–1.00)
Glucose: 95 mg/dL (ref 70–99)
Potassium: 4.2 mmol/L (ref 3.5–5.2)
Sodium: 139 mmol/L (ref 134–144)
eGFR: 77 mL/min/{1.73_m2} (ref >59)

## 2021-12-21 ENCOUNTER — Other Ambulatory Visit: Payer: Self-pay | Admitting: Licensed Practical Nurse

## 2021-12-21 ENCOUNTER — Encounter: Payer: Self-pay | Admitting: Licensed Practical Nurse

## 2021-12-21 DIAGNOSIS — Z79899 Other long term (current) drug therapy: Secondary | ICD-10-CM

## 2021-12-21 NOTE — Progress Notes (Signed)
Potasium level 4.2.  Will start Spironolactone.  Repeat potasium in 2 to 3 months. Order placed.  Message sent through [facility], CNM  Domingo Pulse, [facility]  12/21/21  8:38 AM

## 2021-12-23 ENCOUNTER — Other Ambulatory Visit (HOSPITAL_COMMUNITY): Payer: Self-pay

## 2022-01-01 ENCOUNTER — Encounter: Payer: 59 | Admitting: Family Medicine

## 2022-01-15 ENCOUNTER — Encounter: Payer: Self-pay | Admitting: Licensed Practical Nurse

## 2022-01-20 NOTE — Progress Notes (Unsigned)
Complete physical exam   Patient: Jackie Pearson   DOB: [birth year]   36 y.o. Female  MRN: [id number] Visit Date: 01/21/2022  No chief complaint on file.  Subjective    Jackie Pearson is a 36 y.o. female who presents today for a complete physical exam.   Reports is generally feeling well, fairly well, poorly*; is eating a *** diet; is sleeping well ***; drinks *** bottles of water a day; is exercising ***   HPI  ***  Past Medical History:  Diagnosis Date   Bulimia    Elevated LDL cholesterol level 01/01/2021   Kidney stones    Subclinical hypothyroidism 01/01/2021   No past surgical history on file. Social History   Socioeconomic History   Marital status: Married    Spouse name: Not on file   Number of children: Not on file   Years of education: Not on file   Highest education level: Not on file  Occupational History   Not on file  Tobacco Use   Smoking status: Never   Smokeless tobacco: Never  Substance and Sexual Activity   Alcohol use: Never   Drug use: Never   Sexual activity: Yes    Comment: husband had vasectomy  Other Topics Concern   Not on file  Social History Narrative   Not on file   Social Determinants of Health   Financial Resource Strain: Not on file  Food Insecurity: Not on file  Transportation Needs: Not on file  Physical Activity: Not on file  Stress: Not on file  Social Connections: Not on file  Intimate Partner Violence: Not on file   Family Status  Relation Name Status   Mother  Alive   Father  Alive   Brother  Alive   MGM  Deceased   MGF  Deceased   PGM  Deceased   PGF  Deceased   Family History  Problem Relation Age of Onset   Irregular heart beat Mother    Depression Mother    Anxiety disorder Mother    Transient ischemic attack Mother    AAA (abdominal aortic aneurysm) Father    Hypertension Maternal Grandmother    Coronary artery disease Maternal Grandmother    No Known Allergies  Patient Care Team: Lexine Baton as PCP - General (Physician Assistant)   Medications: Outpatient Medications Prior to Visit  Medication Sig   Ascorbic Acid (VITAMIN C) 1000 MG tablet Take 1,000 mg by mouth daily.   betamethasone dipropionate 0.05 % cream Apply topically 2 (two) times daily.   ibuprofen (ADVIL) 200 MG tablet Take 200 mg by mouth every 6 (six) hours as needed.   loratadine (CLARITIN) 10 MG tablet Take 10 mg by mouth daily.   spironolactone (ALDACTONE) 50 MG tablet Take 1 tablet (50 mg total) by mouth 2 (two) times daily and increase to 2 tablets(100mg ) twice daily as needed   No facility-administered medications prior to visit.    Review of Systems  {Labs (Optional):23779}  The ASCVD Risk score (Arnett DK, et al., 2019) failed to calculate for the following reasons:   The 2019 ASCVD risk score is only valid for ages 68 to 13   Objective    There were no vitals taken for this visit.  {Show previous vital signs (optional):23777}   Physical Exam  ***  Last depression screening scores    12/31/2020    3:01 PM 11/25/2020   11:00 AM  PHQ 2/9 Scores  PHQ - 2 Score  4 0  PHQ- 9 Score 4    Last fall risk screening    11/25/2020   11:00 AM  Fall Risk   Falls in the past year? 0  Number falls in past yr: 0  Injury with Fall? 0  Risk for fall due to : No Fall Risks  Follow up Falls evaluation completed     No results found for any visits on 01/21/22.  Assessment & Plan    Routine Health Maintenance and Physical Exam  Exercise Activities and Dietary recommendations  Goals   None     Immunization History  Administered Date(s) Administered   Tdap 01/16/2015    Health Maintenance  Topic Date Due   HIV Screening  Never done   INFLUENZA VACCINE  02/16/2022   PAP SMEAR-Modifier  11/18/2024   TETANUS/TDAP  01/15/2025   Hepatitis C Screening  Completed   HPV VACCINES  Aged Out   COVID-19 Vaccine  Discontinued    Discussed health benefits of physical activity, and  encouraged her to engage in regular exercise appropriate for her age and condition.  Problem List Items Addressed This Visit   None    No follow-ups on file.     Jake Shark, PA-C

## 2022-01-21 ENCOUNTER — Ambulatory Visit (INDEPENDENT_AMBULATORY_CARE_PROVIDER_SITE_OTHER): Payer: 59 | Admitting: Physician Assistant

## 2022-01-21 ENCOUNTER — Encounter: Payer: Self-pay | Admitting: Physician Assistant

## 2022-01-21 VITALS — BP 130/80 | HR 88 | Ht 65.0 in | Wt 279.0 lb

## 2022-01-21 DIAGNOSIS — Z114 Encounter for screening for human immunodeficiency virus [HIV]: Secondary | ICD-10-CM

## 2022-01-21 DIAGNOSIS — E7800 Pure hypercholesterolemia, unspecified: Secondary | ICD-10-CM | POA: Diagnosis not present

## 2022-01-21 DIAGNOSIS — J309 Allergic rhinitis, unspecified: Secondary | ICD-10-CM

## 2022-01-21 DIAGNOSIS — Z0000 Encounter for general adult medical examination without abnormal findings: Secondary | ICD-10-CM

## 2022-01-21 NOTE — Patient Instructions (Signed)
.Preventative Care for Adults - Female      MAINTAIN REGULAR HEALTH EXAMS: A routine yearly physical is a good way to check in with your primary care provider about your health and preventive screening. It is also an opportunity to share updates about your health and any concerns you have, and receive a thorough all-over exam.  Most health insurance companies pay for at least some preventative services.  Check with your health plan for specific coverages.  WHAT PREVENTATIVE SERVICES DO WOMEN NEED? Adult women should have their weight and blood pressure checked regularly.  Women age 35 and older should have their cholesterol levels checked regularly. Women should be screened for cervical cancer with a Pap smear and pelvic exam beginning at either age 21, or 3 years after they become sexually activity.   Breast cancer screening generally begins at age 40 with a mammogram and breast exam by your primary care provider.   Beginning at age 45 and continuing to age 75, women should be screened for colorectal cancer.  Certain people may need continued testing until age 85. Updating vaccinations is part of preventative care.  Vaccinations help protect against diseases such as the flu. Osteoporosis is a disease in which the bones lose minerals and strength as we age. Women ages 65 and over should discuss this with their caregivers, as should women after menopause who have other risk factors. Lab tests are generally done as part of preventative care to screen for anemia and blood disorders, to screen for problems with the kidneys and liver, to screen for bladder problems, to check blood sugar, and to check your cholesterol level. Preventative services generally include counseling about diet, exercise, avoiding tobacco, drugs, excessive alcohol consumption, and sexually transmitted infections.    GENERAL RECOMMENDATIONS FOR GOOD HEALTH:  Healthy diet: Eat a variety of foods, including fruit, vegetables,  animal or vegetable protein, such as meat, fish, chicken, and eggs, or beans, lentils, tofu, and grains, such as rice. Drink plenty of water daily (60 - 80 ounces or 8 - 10 glasses a day) Decrease saturated fat in the diet, avoid lots of red meat, processed foods, sweets, fast foods, and fried foods. For high cholesterol - Increase fiber intake (Benefiber or Metamucil, Cherrios,  oatmeal, beans, nuts, fruits and vegetables), limit saturated fats (in fried foods, red meat), can add OTC fish oil supplement, eat fish with Omega-3 fatty acids like salmon and tuna, exercise for 30 minutes 3 - 5 times a week, drink 8 - 10 glasses of water a day  Exercise: Aerobic exercise helps maintain good heart health. At least 30-40 minutes of moderate-intensity exercise is recommended. For example, a brisk walk that increases your heart rate and breathing. This should be done on most days of the week.  Find a type of exercise or a variety of exercises that you enjoy so that it becomes a part of your daily life.  Examples are running, walking, swimming, water aerobics, and biking.  For motivation and support, explore group exercise such as aerobic class, spin class, Zumba, Yoga,or  martial arts, etc.   Set exercise goals for yourself, such as a certain weight goal, walk or run in a race such as a 5k walk/run.  Speak to your primary care provider about exercise goals.  Disease prevention: If you smoke or chew tobacco, find out from your caregiver how to quit. It can literally save your life, no matter how long you have been a tobacco user. If you do not   use tobacco, never begin.  Maintain a healthy diet and normal weight. Increased weight leads to problems with blood pressure and diabetes.  The Body Mass Index or BMI is a way of measuring how much of your body is fat. Having a BMI above 27 increases the risk of heart disease, diabetes, hypertension, stroke and other problems related to obesity. Your caregiver can help  determine your BMI and based on it develop an exercise and dietary program to help you achieve or maintain this important measurement at a healthful level. High blood pressure causes heart and blood vessel problems.  Persistent high blood pressure should be treated with medicine if weight loss and exercise do not work.  Fat and cholesterol leaves deposits in your arteries that can block them. This causes heart disease and vessel disease elsewhere in your body.  If your cholesterol is found to be high, or if you have heart disease or certain other medical conditions, then you may need to have your cholesterol monitored frequently and be treated with medication.  Ask if you should have a cardiac stress test if your history suggests this. A stress test is a test done on a treadmill that looks for heart disease. This test can find disease prior to there being a problem. Menopause can be associated with physical symptoms and risks. Hormone replacement therapy is available to decrease these. You should talk to your caregiver about whether starting or continuing to take hormones is right for you.  Osteoporosis is a disease in which the bones lose minerals and strength as we age. This can result in serious bone fractures. Risk of osteoporosis can be identified using a bone density scan. Women ages 65 and over should discuss this with their caregivers, as should women after menopause who have other risk factors. Ask your caregiver whether you should be taking a calcium supplement and Vitamin D, to reduce the rate of osteoporosis.  Avoid drinking alcohol in excess (more than two drinks per day).  Avoid use of street drugs. Do not share needles with anyone. Ask for professional help if you need assistance or instructions on stopping the use of alcohol, cigarettes, and/or drugs. Brush your teeth twice a day with fluoride toothpaste, and floss once a day. Good oral hygiene prevents tooth decay and gum disease. The  problems can be painful, unattractive, and can cause other health problems. Visit your dentist for a routine oral and dental check up and preventive care every 6-12 months.  Look at your skin regularly.  Use a mirror to look at your back. Notify your caregivers of changes in moles, especially if there are changes in shapes, colors, a size larger than a pencil eraser, an irregular border, or development of new moles.  Safety: Use seatbelts 100% of the time, whether driving or as a passenger.  Use safety devices such as hearing protection if you work in environments with loud noise or significant background noise.  Use safety glasses when doing any work that could send debris in to the eyes.  Use a helmet if you ride a bike or motorcycle.  Use appropriate safety gear for contact sports.  Talk to your caregiver about gun safety. Use sunscreen with a SPF (or skin protection factor) of 15 or greater.  Lighter skinned people are at a greater risk of skin cancer. Don't forget to also wear sunglasses in order to protect your eyes from too much damaging sunlight. Damaging sunlight can accelerate cataract formation.  Practice safe sex. Use   condoms. Condoms are used for birth control and to help reduce the spread of sexually transmitted infections (or STIs).  Some of the STIs are gonorrhea (the clap), chlamydia, syphilis, trichomonas, herpes, HPV (human papilloma virus) and HIV (human immunodeficiency virus) which causes AIDS. The herpes, HIV and HPV are viral illnesses that have no cure. These can result in disability, cancer and death.  Keep carbon monoxide and smoke detectors in your home functioning at all times. Change the batteries every 6 months or use a model that plugs into the wall.   Vaccinations: Stay up to date with your tetanus shots and other required immunizations. You should have a booster for tetanus every 10 years. Be sure to get your flu shot every year, since 5%-20% of the [place] population comes  down with the flu. The flu vaccine changes each year, so being vaccinated once is not enough. Get your shot in the fall, before the flu season peaks.   Other vaccines to consider: Human Papilloma Virus or HPV causes cancer of the cervix, and other infections that can be transmitted from person to person. There is a vaccine for HPV, and females should get immunized between the ages of 11 and 26. It requires a series of 3 shots.  Pneumococcal vaccine to protect against certain types of pneumonia.  This is normally recommended for adults age 65 or older.  However, adults younger than 35 years old with certain underlying conditions such as diabetes, heart or lung disease should also receive the vaccine. Shingles vaccine to protect against Varicella Zoster if you are older than age 60, or younger than 35 years old with certain underlying illness. Hepatitis A vaccine to protect against a form of infection of the liver by a virus acquired from food. Hepatitis B vaccine to protect against a form of infection of the liver by a virus acquired from blood or body fluids, particularly if you work in health care. If you plan to travel internationally, check with your local health department for specific vaccination recommendations.  Cancer Screening: Breast cancer screening is essential to preventive care for women. All women age 20 and older should perform a breast self-exam every month. At age 40 and older, women should have their caregiver complete a breast exam each year. Women at ages 40 and older should have a mammogram (x-ray film) of the breasts. Your caregiver can discuss how often you need mammograms.   Cervical cancer screening includes taking a Pap smear (sample of cells examined under a microscope) from the cervix (end of the uterus). It also includes testing for HPV (Human Papilloma Virus, which can cause cervical cancer). Screening and a pelvic exam should begin at age 21, or 3 years after a woman  becomes sexually active. Screening should occur every year, with a Pap smear but no HPV testing, up to age 30. After age 30, you should have a Pap smear every 3 years with HPV testing, if no HPV was found previously.  Most routine colon cancer screening begins at the age of 50. On a yearly basis, doctors may provide special easy to use take-home tests to check for hidden blood in the stool. Sigmoidoscopy or colonoscopy can detect the earliest forms of colon cancer and is life saving. These tests use a small camera at the end of a tube to directly examine the colon. Speak to your caregiver about this at age 50, when routine screening begins (and is repeated every 5 years unless early forms of pre-cancerous polyps   or small growths are found).    

## 2022-01-21 NOTE — Assessment & Plan Note (Signed)
Stable, OTC antihistamine, nasal steroid spray, and expectorant as needed; if worse can refer to Allergist for further evaluation and allergy testing ? ?

## 2022-01-21 NOTE — Assessment & Plan Note (Signed)
controlled, eat a low fat diet, increase fiber intake (Benefiber or Metamucil, Cherrios,  oatmeal, beans, nuts, fruits and vegetables), limit saturated fats (in fried foods, red meat), can add OTC fish oil supplement, eat fish with Omega-3 fatty acids like salmon and tuna, exercise for 30 minutes 3 - 5 times a week, drink 8 - 10 glasses of water a day ? ? ?

## 2022-01-22 LAB — CBC WITH DIFFERENTIAL/PLATELET
Basophils Absolute: 0.1 10*3/uL (ref 0.0–0.2)
Basos: 1 %
EOS (ABSOLUTE): 0.3 10*3/uL (ref 0.0–0.4)
Eos: 2 %
Hematocrit: 41 % (ref 34.0–46.6)
Hemoglobin: 13.5 g/dL (ref 11.1–15.9)
Immature Grans (Abs): 0 10*3/uL (ref 0.0–0.1)
Immature Granulocytes: 0 %
Lymphocytes Absolute: 1.9 10*3/uL (ref 0.7–3.1)
Lymphs: 18 %
MCH: 29.7 pg (ref 26.6–33.0)
MCHC: 32.9 g/dL (ref 31.5–35.7)
MCV: 90 fL (ref 79–97)
Monocytes Absolute: 0.9 10*3/uL (ref 0.1–0.9)
Monocytes: 8 %
Neutrophils Absolute: 7.4 10*3/uL — ABNORMAL HIGH (ref 1.4–7.0)
Neutrophils: 71 %
Platelets: 280 10*3/uL (ref 150–450)
RBC: 4.55 x10E6/uL (ref 3.77–5.28)
RDW: 12.9 % (ref 11.7–15.4)
WBC: 10.6 10*3/uL (ref 3.4–10.8)

## 2022-01-22 LAB — COMPREHENSIVE METABOLIC PANEL
ALT: 20 IU/L (ref 0–32)
AST: 18 IU/L (ref 0–40)
Albumin/Globulin Ratio: 1.8 (ref 1.2–2.2)
Albumin: 4.4 g/dL (ref 3.8–4.8)
Alkaline Phosphatase: 83 IU/L (ref 44–121)
BUN/Creatinine Ratio: 11 (ref 9–23)
BUN: 11 mg/dL (ref 6–20)
Bilirubin Total: <0.2 mg/dL (ref 0.0–1.2)
CO2: 21 mmol/L (ref 20–29)
Calcium: 9.8 mg/dL (ref 8.7–10.2)
Chloride: 101 mmol/L (ref 96–106)
Creatinine, Ser: 0.97 mg/dL (ref 0.57–1.00)
Globulin, Total: 2.5 g/dL (ref 1.5–4.5)
Glucose: 96 mg/dL (ref 70–99)
Potassium: 4.4 mmol/L (ref 3.5–5.2)
Sodium: 136 mmol/L (ref 134–144)
Total Protein: 6.9 g/dL (ref 6.0–8.5)
eGFR: 78 mL/min/{1.73_m2} (ref >59)

## 2022-01-22 LAB — HIV ANTIBODY (ROUTINE TESTING W REFLEX): HIV Screen 4th Generation wRfx: NONREACTIVE

## 2022-01-22 LAB — LIPID PANEL
Chol/HDL Ratio: 4 ratio (ref 0.0–4.4)
Cholesterol, Total: 212 mg/dL — ABNORMAL HIGH (ref 100–199)
HDL: 53 mg/dL (ref >39)
LDL Chol Calc (NIH): 134 mg/dL — ABNORMAL HIGH (ref 0–99)
Triglycerides: 142 mg/dL (ref 0–149)
VLDL Cholesterol Cal: 25 mg/dL (ref 5–40)

## 2022-01-29 ENCOUNTER — Other Ambulatory Visit (HOSPITAL_COMMUNITY): Payer: Self-pay

## 2022-03-24 ENCOUNTER — Encounter: Payer: Self-pay | Admitting: Internal Medicine

## 2022-06-01 ENCOUNTER — Encounter: Payer: Self-pay | Admitting: Internal Medicine

## 2022-07-15 ENCOUNTER — Telehealth: Payer: 59 | Admitting: Physician Assistant

## 2022-07-15 DIAGNOSIS — B360 Pityriasis versicolor: Secondary | ICD-10-CM | POA: Diagnosis not present

## 2022-07-15 MED ORDER — KETOCONAZOLE 2 % EX SHAM
MEDICATED_SHAMPOO | CUTANEOUS | 0 refills | Status: DC
Start: 2022-07-15 — End: 2024-02-22

## 2022-07-15 NOTE — Progress Notes (Signed)
E Visit for Rash  We are sorry that you are not feeling well. Here is how we plan to help!  I do believe you have Tinea Versicolor as well. I have prescribed Ketoconazole shampoo. Use this as a body wash three times weekly for the first week, then twice weekly until rash resolves. This can be drying to the skin, so moisturize well. Avoid steroids, such as Hydrocortisone, as this can make the rash worse. Also, can use Selsun Blue shampoo as a body wash once weekly after rash is gone to prevent recurrence.   HOME CARE:  Take cool showers and avoid direct sunlight. Apply cool compress or wet dressings. Take a bath in an oatmeal bath.  Sprinkle content of one Aveeno packet under running faucet with comfortably warm water.  Bathe for 15-20 minutes, 1-2 times daily.  Pat dry with a towel. Do not rub the rash. Use hydrocortisone cream. Take an antihistamine like Benadryl for widespread rashes that itch.  The adult dose of Benadryl is 25-50 mg by mouth 4 times daily. Caution:  This type of medication may cause sleepiness.  Do not drink alcohol, drive, or operate dangerous machinery while taking antihistamines.  Do not take these medications if you have prostate enlargement.  Read package instructions thoroughly on all medications that you take.  GET HELP RIGHT AWAY IF:  Symptoms don't go away after treatment. Severe itching that persists. If you rash spreads or swells. If you rash begins to smell. If it blisters and opens or develops a yellow-brown crust. You develop a fever. You have a sore throat. You become short of breath.  MAKE SURE YOU:  Understand these instructions. Will watch your condition. Will get help right away if you are not doing well or get worse.  Thank you for choosing an e-visit.  Your e-visit answers were reviewed by a board certified advanced clinical practitioner to complete your personal care plan. Depending upon the condition, your plan could have included both over  the counter or prescription medications.  Please review your pharmacy choice. Make sure the pharmacy is open so you can pick up prescription now. If there is a problem, you may contact your provider through [employer] and have the prescription routed to another pharmacy.  Your safety is important to [place]. If you have drug allergies check your prescription carefully.   For the next 24 hours you can use MyChart to ask questions about today's visit, request a non-urgent call back, or ask for a work or school excuse. You will get an email in the next two days asking about your experience. I hope that your e-visit has been valuable and will speed your recovery.  I have spent 5 minutes in review of e-visit questionnaire, review and updating patient chart, medical decision making and response to patient.   Margaretann Loveless, PA-C

## 2024-02-22 ENCOUNTER — Encounter: Payer: Self-pay | Admitting: Family Medicine

## 2024-02-22 ENCOUNTER — Other Ambulatory Visit (HOSPITAL_COMMUNITY): Payer: Self-pay

## 2024-02-22 ENCOUNTER — Ambulatory Visit: Admitting: Family Medicine

## 2024-02-22 VITALS — BP 140/88 | HR 79 | Ht 65.0 in | Wt 275.2 lb

## 2024-02-22 DIAGNOSIS — H579 Unspecified disorder of eye and adnexa: Secondary | ICD-10-CM | POA: Diagnosis not present

## 2024-02-22 DIAGNOSIS — L84 Corns and callosities: Secondary | ICD-10-CM

## 2024-02-22 DIAGNOSIS — J3081 Allergic rhinitis due to animal (cat) (dog) hair and dander: Secondary | ICD-10-CM

## 2024-02-22 MED ORDER — MONTELUKAST SODIUM 10 MG PO TABS
10.0000 mg | ORAL_TABLET | Freq: Every day | ORAL | 3 refills | Status: DC
  Filled 2024-02-22 – 2024-03-08 (×3): qty 30, 30d supply, fill #0
  Filled 2024-04-11: qty 30, 30d supply, fill #1
  Filled 2024-05-19: qty 30, 30d supply, fill #2
  Filled 2024-06-23: qty 30, 30d supply, fill #3

## 2024-02-22 MED ORDER — SALICYLIC ACID 40 % EX MISC
1.0000 | CUTANEOUS | 1 refills | Status: AC
  Filled 2024-02-22 – 2024-02-28 (×2): qty 25, 25d supply, fill #0
  Filled 2024-03-27: qty 25, 25d supply, fill #1

## 2024-02-22 NOTE — Progress Notes (Signed)
 Name: Jackie Pearson   Date of Visit: 02/22/24   Date of last visit with me: Visit date not found   CHIEF COMPLAINT:  Chief Complaint  Patient presents with   Establish Care    Transfer of care, allergies. Having trouble with allergies all year. Does live with cats that she's allergic to, wants a referral to a podiatrist.        HPI:  Discussed the use of AI scribe software for clinical note transcription with the patient, who gave verbal consent to proceed.  History of Present Illness   Jackie Pearson is a 38 year old female with a history of allergies who presents with worsening nasal congestion and difficulty breathing.  She has a history of allergies confirmed by a prick test, which have worsened since a respiratory infection last year. Her symptoms include significant nasal congestion and difficulty breathing, described as 'I can't breathe'.  She has been using over-the-counter medications such as Claritin and Zyrtec, and nasal sprays like Flonase. Afrin sprays provided immediate relief. She uses Flonase nightly, two sprays in each nostril, and Afrin only as needed.  She experiences itchy and red eyes, for which she uses lubricating eye drops like Lumify. These symptoms do not cause significant distress.  She has a callus on her right foot, which she manages with salicylic acid  and shaving. The callus returns if she stops treatment.  She owns five to six cats and vacuums daily to manage allergens. Despite these efforts, her symptoms have progressively worsened, impacting her ability to breathe. No sore throat.         OBJECTIVE:       01/21/2022    8:21 AM  Depression screen PHQ 2/9  Decreased Interest 0  Down, Depressed, Hopeless 0  PHQ - 2 Score 0     BP Readings from Last 3 Encounters:  02/22/24 (!) 140/88  01/21/22 130/80  11/18/21 122/82    BP (!) 140/88   Pulse 79   Ht 5' 5 (1.651 m)   Wt 275 lb 3.2 oz (124.8 kg)   SpO2 99%   BMI 45.80 kg/m     Physical Exam          Physical Exam Constitutional:      Appearance: Normal appearance.  HENT:     Nose: Mucosal edema present. No nasal deformity, nasal tenderness, congestion or rhinorrhea.     Right Turbinates: Swollen.     Left Turbinates: Swollen.  Neurological:     Mental Status: She is alert.     ASSESSMENT/PLAN:   Assessment & Plan Allergic rhinitis due to animal hair and dander  Itchy eyes  Callus of foot    Assessment and Plan    Allergic acute rhinitis with chronic nasal congestion Chronic nasal congestion likely due to allergic rhinitis, exacerbated by cat exposure. Previous Afrin use may have caused rebound congestion. - Patient has no plans to remove cats.  - Continue Claritin or Zyrtec daily. - Use Flonase nasal spray nightly, two sprays per nostril. - Discontinue Afrin unless necessary. - Start montelukast  nightly. - Monitor for behavioral changes with montelukast , report aggression. - Consider eye drops if itchy eyes persist after montelukast . - Vacuum frequently, open curtains for sunlight, use humidifier. - Follow up in 4-6 weeks to assess treatment response.  Callus of right foot Recurrent callus previously managed with salicylic acid  and shaving. - Prescribe salicylic acid  for callus treatment. - Refer to podiatrist in [place] for further management.  Trace Wirick A. Vita MD [facility]

## 2024-02-23 ENCOUNTER — Other Ambulatory Visit (HOSPITAL_COMMUNITY): Payer: Self-pay

## 2024-02-27 ENCOUNTER — Encounter (HOSPITAL_COMMUNITY): Payer: Self-pay

## 2024-02-28 ENCOUNTER — Other Ambulatory Visit (HOSPITAL_COMMUNITY): Payer: Self-pay

## 2024-03-01 ENCOUNTER — Other Ambulatory Visit (HOSPITAL_COMMUNITY): Payer: Self-pay

## 2024-03-01 ENCOUNTER — Other Ambulatory Visit: Payer: Self-pay

## 2024-03-01 ENCOUNTER — Other Ambulatory Visit (HOSPITAL_BASED_OUTPATIENT_CLINIC_OR_DEPARTMENT_OTHER): Payer: Self-pay

## 2024-03-02 ENCOUNTER — Other Ambulatory Visit: Payer: Self-pay

## 2024-03-02 ENCOUNTER — Encounter: Payer: Self-pay | Admitting: Pharmacist

## 2024-03-05 ENCOUNTER — Other Ambulatory Visit (HOSPITAL_COMMUNITY): Payer: Self-pay

## 2024-03-05 ENCOUNTER — Other Ambulatory Visit: Payer: Self-pay

## 2024-03-07 ENCOUNTER — Other Ambulatory Visit: Payer: Self-pay

## 2024-03-08 ENCOUNTER — Other Ambulatory Visit (HOSPITAL_COMMUNITY): Payer: Self-pay

## 2024-03-09 ENCOUNTER — Other Ambulatory Visit (HOSPITAL_COMMUNITY): Payer: Self-pay

## 2024-03-26 ENCOUNTER — Ambulatory Visit: Admitting: Family Medicine

## 2024-03-26 ENCOUNTER — Other Ambulatory Visit: Payer: Self-pay

## 2024-03-26 ENCOUNTER — Encounter (HOSPITAL_COMMUNITY): Payer: Self-pay | Admitting: Pharmacist

## 2024-03-26 ENCOUNTER — Other Ambulatory Visit (HOSPITAL_COMMUNITY): Payer: Self-pay

## 2024-03-26 ENCOUNTER — Encounter: Payer: Self-pay | Admitting: Family Medicine

## 2024-03-26 VITALS — BP 124/80 | HR 84 | Wt 278.0 lb

## 2024-03-26 DIAGNOSIS — R110 Nausea: Secondary | ICD-10-CM | POA: Diagnosis not present

## 2024-03-26 DIAGNOSIS — J3081 Allergic rhinitis due to animal (cat) (dog) hair and dander: Secondary | ICD-10-CM | POA: Diagnosis not present

## 2024-03-26 DIAGNOSIS — Z0000 Encounter for general adult medical examination without abnormal findings: Secondary | ICD-10-CM

## 2024-03-26 DIAGNOSIS — Z136 Encounter for screening for cardiovascular disorders: Secondary | ICD-10-CM | POA: Diagnosis not present

## 2024-03-26 LAB — BASIC METABOLIC PANEL WITH GFR
BUN/Creatinine Ratio: 12 (ref 9–23)
BUN: 12 mg/dL (ref 6–20)
CO2: 20 mmol/L (ref 20–29)
Calcium: 9.5 mg/dL (ref 8.7–10.2)
Chloride: 102 mmol/L (ref 96–106)
Creatinine, Ser: 0.99 mg/dL (ref 0.57–1.00)
Glucose: 99 mg/dL (ref 70–99)
Potassium: 4.2 mmol/L (ref 3.5–5.2)
Sodium: 139 mmol/L (ref 134–144)
eGFR: 75 mL/min/1.73 (ref >59)

## 2024-03-26 LAB — LIPID PANEL
Chol/HDL Ratio: 4.3 ratio (ref 0.0–4.4)
Cholesterol, Total: 213 mg/dL — ABNORMAL HIGH (ref 100–199)
HDL: 50 mg/dL (ref >39)
LDL Chol Calc (NIH): 142 mg/dL — ABNORMAL HIGH (ref 0–99)
Triglycerides: 116 mg/dL (ref 0–149)
VLDL Cholesterol Cal: 21 mg/dL (ref 5–40)

## 2024-03-26 LAB — POCT GLYCOSYLATED HEMOGLOBIN (HGB A1C): Hemoglobin A1C: 5.3 % (ref 4.0–5.6)

## 2024-03-26 LAB — THYROID CASCADE PROFILE: TSH: 3.84 u[IU]/mL (ref 0.450–4.500)

## 2024-03-26 MED ORDER — ONDANSETRON 4 MG PO TBDP
4.0000 mg | ORAL_TABLET | Freq: Three times a day (TID) | ORAL | 0 refills | Status: AC | PRN
  Filled 2024-03-26: qty 20, 7d supply, fill #0

## 2024-03-26 MED ORDER — PHENDIMETRAZINE TARTRATE 35 MG PO TABS
35.0000 mg | ORAL_TABLET | Freq: Every day | ORAL | 1 refills | Status: DC
  Filled 2024-03-26 (×2): qty 30, 30d supply, fill #0
  Filled 2024-04-11 – 2024-04-28 (×2): qty 30, 30d supply, fill #1

## 2024-03-26 MED ORDER — TIRZEPATIDE 2.5 MG/0.5ML ~~LOC~~ SOAJ
2.5000 mg | SUBCUTANEOUS | 1 refills | Status: DC
  Filled 2024-03-26: qty 2, 28d supply, fill #0

## 2024-03-26 MED ORDER — TIRZEPATIDE-WEIGHT MANAGEMENT 2.5 MG/0.5ML ~~LOC~~ SOAJ
2.5000 mg | SUBCUTANEOUS | 1 refills | Status: AC
  Filled 2024-03-26: qty 2, 28d supply, fill #0

## 2024-03-26 NOTE — Progress Notes (Signed)
 Name: Chanee Henrickson   Date of Visit: 03/26/24   Date of last visit with me: 02/22/2024   CHIEF COMPLAINT:  Chief Complaint  Patient presents with   Annual Exam    Physical.        HPI:  Discussed the use of AI scribe software for clinical note transcription with the patient, who gave verbal consent to proceed.  History of Present Illness   Fallynn Gravett is a 38 year old female with hypothyroidism who presents with concerns about weight management and allergies.  She has been experiencing allergies, which have improved with the use of Singulair  and Claritin. She has not been using Flonase regularly due to a lack of congestion. She has been on Flonase for months and only recently stopped for two days without issues.  She was diagnosed with hypothyroidism in 2022 and has been managing her weight through regular exercise and a healthy diet. Despite these efforts, she feels she can only maintain her current weight. She exercises for an hour daily and eats well but has a history of bulimia since age 80, which affects her digestion.  She was evaluated for PCOS but did not have cystic ovaries or irregular periods, though she experiences excess hair growth. Previous tests did not confirm PCOS, and she was told it might be genetic. She is concerned about weight management and has been considering injectable medications for weight loss.  She drinks a significant amount of water daily, consuming at least two Brita pitchers worth. She prefers activities like dance and high-intensity exercise. Her last Pap smear was two years ago, and she believes she received the HPV vaccine in high school. She is due for her next Pap smear in May of the following year.         OBJECTIVE:       03/26/2024   10:07 AM  Depression screen PHQ 2/9  Decreased Interest 0  Down, Depressed, Hopeless 1  PHQ - 2 Score 1     BP Readings from Last 3 Encounters:  03/26/24 124/80  02/22/24 (!) 140/88  01/21/22  130/80    BP 124/80   Pulse 84   Wt 278 lb (126.1 kg)   SpO2 98%   BMI 46.26 kg/m    Physical Exam          Physical Exam Constitutional:      Appearance: Normal appearance. She is obese.  Neurological:     General: No focal deficit present.     Mental Status: She is alert and oriented to person, place, and time. Mental status is at baseline.     ASSESSMENT/PLAN:   Assessment & Plan Annual physical exam  Allergic rhinitis due to animal hair and dander  Morbid obesity (HCC)  Encounter for screening for cardiovascular disorders  Nausea    Assessment and Plan    Obesity Obesity with hypothyroidism and possible PCOS. Discussed GLP-1 receptor agonists for weight loss, focusing on reduced hunger and satiety signaling. Emphasized low-caloric diet and hydration. - Prescribe Mounjaro  (tirzepatide ) for weight loss with titration based on tolerance and effectiveness. - Check thyroid  function, A1c, cholesterol, and electrolytes. - Schedule follow-up in 3 weeks to assess response to medication and adjust dosage as needed.  Hypothyroidism Thyroid  function to be checked to assess contribution to weight issues. - Check thyroid  function.  Bulimia nervosa Potential nausea with GLP-1 receptor agonist use discussed. Zofran  provided for nausea management with caution against regular use. - Prescribe Zofran  for nausea management if needed.  Allergic rhinitis Importance of daily Flonase use for prevention and steady state effectiveness explained. - Instruct to use Flonase daily as a preventative measure.  Adult Wellness Visit Vaccination status and Pap smear history reviewed. Last Pap smear 2 years ago, due next year. Co-testing with Pap smear for extended interval screening discussed. - Schedule next Pap smear for May 2026. - Discuss co-testing with Pap smear for extended interval screening.         Jmari Pelc A. Vita MD [facility]

## 2024-03-26 NOTE — Addendum Note (Signed)
 Addended by: Vidalia Serpas on: 03/26/2024 01:39 PM   Modules accepted: Orders

## 2024-03-27 ENCOUNTER — Other Ambulatory Visit: Payer: Self-pay

## 2024-03-27 ENCOUNTER — Ambulatory Visit: Payer: Self-pay | Admitting: Family Medicine

## 2024-03-28 ENCOUNTER — Other Ambulatory Visit: Payer: Self-pay

## 2024-03-28 ENCOUNTER — Ambulatory Visit: Admitting: Podiatry

## 2024-03-28 ENCOUNTER — Other Ambulatory Visit (HOSPITAL_COMMUNITY): Payer: Self-pay

## 2024-04-04 ENCOUNTER — Other Ambulatory Visit (HOSPITAL_COMMUNITY): Payer: Self-pay

## 2024-04-11 ENCOUNTER — Other Ambulatory Visit: Payer: Self-pay

## 2024-04-11 ENCOUNTER — Other Ambulatory Visit (HOSPITAL_COMMUNITY): Payer: Self-pay

## 2024-04-16 ENCOUNTER — Ambulatory Visit: Admitting: Family Medicine

## 2024-04-30 ENCOUNTER — Other Ambulatory Visit: Payer: Self-pay

## 2024-05-01 ENCOUNTER — Other Ambulatory Visit (HOSPITAL_COMMUNITY): Payer: Self-pay

## 2024-05-01 ENCOUNTER — Other Ambulatory Visit: Payer: Self-pay

## 2024-05-19 ENCOUNTER — Other Ambulatory Visit (HOSPITAL_COMMUNITY): Payer: Self-pay

## 2024-06-23 ENCOUNTER — Other Ambulatory Visit (HOSPITAL_COMMUNITY): Payer: Self-pay

## 2024-07-25 ENCOUNTER — Other Ambulatory Visit: Payer: Self-pay

## 2024-07-25 ENCOUNTER — Other Ambulatory Visit (HOSPITAL_COMMUNITY): Payer: Self-pay

## 2024-07-25 ENCOUNTER — Other Ambulatory Visit: Payer: Self-pay | Admitting: Family Medicine

## 2024-07-25 DIAGNOSIS — H579 Unspecified disorder of eye and adnexa: Secondary | ICD-10-CM

## 2024-07-25 DIAGNOSIS — J3081 Allergic rhinitis due to animal (cat) (dog) hair and dander: Secondary | ICD-10-CM

## 2024-07-25 MED ORDER — MONTELUKAST SODIUM 10 MG PO TABS
10.0000 mg | ORAL_TABLET | Freq: Every day | ORAL | 0 refills | Status: AC
  Filled 2024-07-25: qty 30, 30d supply, fill #0

## 2024-07-27 ENCOUNTER — Other Ambulatory Visit (HOSPITAL_COMMUNITY): Payer: Self-pay

## 2024-07-27 ENCOUNTER — Ambulatory Visit: Admitting: Family Medicine

## 2024-07-27 ENCOUNTER — Telehealth: Payer: Self-pay

## 2024-07-27 VITALS — BP 136/84 | HR 87 | Wt 263.8 lb

## 2024-07-27 DIAGNOSIS — S39012A Strain of muscle, fascia and tendon of lower back, initial encounter: Secondary | ICD-10-CM | POA: Diagnosis not present

## 2024-07-27 DIAGNOSIS — R634 Abnormal weight loss: Secondary | ICD-10-CM | POA: Diagnosis not present

## 2024-07-27 MED ORDER — CYCLOBENZAPRINE HCL 10 MG PO TABS
10.0000 mg | ORAL_TABLET | Freq: Three times a day (TID) | ORAL | 0 refills | Status: AC | PRN
  Filled 2024-07-27: qty 30, 10d supply, fill #0

## 2024-07-27 MED ORDER — PHENTERMINE HCL 37.5 MG PO TABS
37.5000 mg | ORAL_TABLET | Freq: Every day | ORAL | 0 refills | Status: AC
  Filled 2024-07-27: qty 90, 90d supply, fill #0

## 2024-07-27 MED ORDER — MELOXICAM 15 MG PO TABS
15.0000 mg | ORAL_TABLET | Freq: Every day | ORAL | 0 refills | Status: AC
  Filled 2024-07-27: qty 30, 30d supply, fill #0

## 2024-07-27 NOTE — Progress Notes (Signed)
" ° °  Name: Jackie Pearson   Date of Visit: 07/27/2024   Date of last visit with me: 07/25/2024   CHIEF COMPLAINT:  Chief Complaint  Patient presents with   Acute Visit    Back pain. Was picking up her 80lb dog, bent down the wrong way heard a pop. Got better than got sick and it is back to hurting.        HPI:  Discussed the use of AI scribe software for clinical note transcription with the patient, who gave verbal consent to proceed.  History of Present Illness   Jackie Pearson is a 39 year old female who presents with lower back pain following an incident on Thanksgiving Day.  She experienced a sudden 'snap, twinge, pain' in her lower back while lifting her 80-pound dog on Thanksgiving Day. The pain is localized to her lower spine and does not radiate to her hips. She describes the pain as 'emanating' from the spine and notes that bending forward does not alleviate the discomfort, while stepping back does not worsen it.  She has been managing the pain with self-directed physical therapy, including stretches, slow dancing, and yoga, which initially provided some relief. However, the pain has persisted and occasionally worsens, impacting her ability to perform daily activities and exercise, making her feel 'miserable' and 'useless'.  She has a history of weight fluctuations and has recently lost approximately 13 pounds, going from 278 to 263 pounds. She attributes some of this weight loss to a medication she was previously on, which also helped with focus and possibly ADHD symptoms. She has been off this medication for over a month but is interested in resuming it.  No hip pain. The pain does not improve with bending forward or worsen with stepping back.         OBJECTIVE:       03/26/2024   10:07 AM  Depression screen PHQ 2/9  Decreased Interest 0  Down, Depressed, Hopeless 1  PHQ - 2 Score 1     BP Readings from Last 3 Encounters:  07/27/24 136/84  03/26/24 124/80  02/22/24 (!)  140/88    BP 136/84   Pulse 87   Wt 263 lb 12.8 oz (119.7 kg)   SpO2 98%   BMI 43.90 kg/m    Physical Exam   MEASUREMENTS: Weight- 263. MUSCULOSKELETAL: Tenderness in the lower spine.      Physical Exam   TTP over lumbar spine, no paraspinal tenderness. ROM decrease with flexion and extension  ASSESSMENT/PLAN:   Assessment & Plan Strain of lumbar region, initial encounter  Weight loss    Assessment and Plan    Lumbar strain Likely due to improper lifting, causing muscle spasm and localized pain. Symptoms improved with self-directed therapy. Differential includes muscle spasm. - Prescribed meloxicam  daily for two weeks. - Prescribed muscle relaxer for sleep. - Advised against ice; continue heat application. - Ordered lumbar spine x-ray at [facility]. - Discussed recurrence potential and importance of post-recovery strengthening exercises.  Abnormal weight loss Weight loss of 13 pounds attributed to increased activity and dietary changes. Previous stimulant use aided weight loss and focus. - Continue current regimen with physical activity and dietary modifications. - Prescribed stimulant medication to aid weight loss and focus, contingent on continued weight loss.         Twylla Arceneaux A. Vita MD [facility] "

## 2024-07-27 NOTE — Patient Instructions (Signed)
 Please go get your xrays done at [facility]. You do not need to make an appointment. You can just show up.   Address: [address] [place], [place], [place] [address]

## 2024-07-27 NOTE — Telephone Encounter (Signed)
 Copied from CRM [id number]. Topic: General - Other >> Jul 27, 2024 12:25 PM Amy B wrote: Reason for CRM: Patient states she had gave a urine sample today and wanted provider to know that she is currently menstruating.  Also, she went to [facility] and they x-ray machine is down so she could not have her x-ray done.
